# Patient Record
Sex: Male | Born: 1965 | Hispanic: Yes | Smoking: Never smoker
Health system: Southern US, Community
[De-identification: ages and names within clinical notes are randomized; demographics above are authoritative.]

## PROBLEM LIST (undated history)

## (undated) DIAGNOSIS — J329 Chronic sinusitis, unspecified: Secondary | ICD-10-CM

## (undated) DIAGNOSIS — I10 Essential (primary) hypertension: Secondary | ICD-10-CM

## (undated) DIAGNOSIS — G4730 Sleep apnea, unspecified: Secondary | ICD-10-CM

## (undated) DIAGNOSIS — E7800 Pure hypercholesterolemia, unspecified: Secondary | ICD-10-CM

## (undated) DIAGNOSIS — D759 Disease of blood and blood-forming organs, unspecified: Secondary | ICD-10-CM

## (undated) HISTORY — PX: HERNIA REPAIR: SHX51

## (undated) HISTORY — PX: KNEE SURGERY: SHX244

---

## 2017-04-10 ENCOUNTER — Other Ambulatory Visit: Payer: Self-pay | Admitting: Otolaryngology

## 2017-04-10 NOTE — Pre-Procedure Instructions (Signed)
Tim Hunt  04/10/2017      [id number] [name] [name] [address] Phone: [phone] Fax: [phone]    Your procedure is scheduled on Monday October 8.  Report to [facility] Admitting at 5:30 A.M.  Call this number if you have problems the morning of surgery:  [phone]   Remember:  Do not eat food or drink liquids after midnight.  Take these medicines the morning of surgery with A SIP OF WATER: amlodipine (norvasc), rosuvastatin (crestor)  7 days prior to surgery STOP taking any Aspirin (unless otherwise instructed by your surgeon), Aleve, Naproxen, Ibuprofen, Motrin, Advil, Goody's, BC's, all herbal medications, fish oil, and all vitamins    Do not wear jewelry, make-up or nail polish.  Do not wear lotions, powders, or perfumes, or deoderant.  Do not shave 48 hours prior to surgery.  Men may shave face and neck.  Do not bring valuables to the hospital.  [facility] is not responsible for any belongings or valuables.  Contacts, dentures or bridgework may not be worn into surgery.  Leave your suitcase in the car.  After surgery it may be brought to your room.  For patients admitted to the hospital, discharge time will be determined by your treatment team.  Patients discharged the day of surgery will not be allowed to drive home.    Special instructions:    [facility]- Preparing For Surgery  Before surgery, you can play an important role. Because skin is not sterile, your skin needs to be as free of germs as possible. You can reduce the number of germs on your skin by washing with CHG (chlorahexidine gluconate) Soap before surgery.  CHG is an antiseptic cleaner which kills germs and bonds with the skin to continue killing germs even after washing.  Please do not use if you have an allergy to CHG or antibacterial soaps. If your skin becomes reddened/irritated stop using the CHG.  Do not shave  (including legs and underarms) for at least 48 hours prior to first CHG shower. It is OK to shave your face.  Please follow these instructions carefully.   1. Shower the NIGHT BEFORE SURGERY and the MORNING OF SURGERY with CHG.   2. If you chose to wash your hair, wash your hair first as usual with your normal shampoo.  3. After you shampoo, rinse your hair and body thoroughly to remove the shampoo.  4. Use CHG as you would any other liquid soap. You can apply CHG directly to the skin and wash gently with a scrungie or a clean washcloth.   5. Apply the CHG Soap to your body ONLY FROM THE NECK DOWN.  Do not use on open wounds or open sores. Avoid contact with your eyes, ears, mouth and genitals (private parts). Wash genitals (private parts) with your normal soap.  USE REGULAR SHAMPOO AND CONDITIONER FOR HAIR USE REGULAR SOAP FOR FACE AND PRIVATE AREA  6. Wash thoroughly, paying special attention to the area where your surgery will be performed.  7. Thoroughly rinse your body with warm water from the neck down.  8. DO NOT shower/wash with your normal soap after using and rinsing off the CHG Soap.  9. Pat yourself dry with a CLEAN TOWEL and WASH CLOTH  10. Wear CLEAN PAJAMAS to bed the night before surgery, wear comfortable clothes the morning of surgery  11. Place CLEAN SHEETS on your bed the night  of your first shower and DO NOT SLEEP WITH PETS.    Day of Surgery: Do not apply any deodorants/lotions. Please wear clean clothes to the hospital/surgery center.                                                   Instrucciones Para Antes de la Ciruga   Su ciruga est programada para-(your procedure is scheduled on) October 8   [facility] Admitting - (enter)    Por favor llame al [phone] si tiene algn problema en la maana de la ciruga. (please call if you have any problems the morning of surgery.)                  Recuerde: (Remember)   No coma  alimentos ni tome lquidos, incluyendo agua, despus de la medianoche  (Do not eat food or drink liquids including water after midnight on_______________   [name] estas medicinas en la maana de la ciruga con un SORBITO de agua (take these meds the morning of surgery with a SIP of water): Amlodipine (norvasc), crestor (rosuvastatin)   Puede cepillarse los dientes en la maana de la [place]. (you may brush your teeth the morning of surgery)   No use joyas, maquillaje de ojos, lpiz labial, crema para el cuerpo o esmalte de uas oscuro. (Do not wear jewelry, eye makeup, lipstick, body lotion, or dark fingernail polish)   No puede usar desodorante. (you may wear deodorant)   Si va a ser ingresado despues de la ciruga, deje la maleta en el carro hasta que se le haya asignado una habitacin. (If you are to be admitted after surgery, leave suitcase in car until your room has been assigned.)   A los pacientes que se les d de alta el mismo da no se les permitir manejar a casa.  (Patients discharged on the day of surgery will not be allowed to drive home)   Use ropa suelta y cmoda de regreso a casa. (wear loose comfortable clothes for ride home)

## 2017-04-11 ENCOUNTER — Encounter (HOSPITAL_COMMUNITY): Payer: Self-pay

## 2017-04-11 ENCOUNTER — Encounter (HOSPITAL_COMMUNITY)
Admission: RE | Admit: 2017-04-11 | Discharge: 2017-04-11 | Disposition: A | Discharge status: Home / Self Care | Payer: BLUE CROSS/BLUE SHIELD | Source: Ambulatory Visit | Attending: Otolaryngology | Admitting: Otolaryngology

## 2017-04-11 DIAGNOSIS — E7800 Pure hypercholesterolemia, unspecified: Secondary | ICD-10-CM | POA: Insufficient documentation

## 2017-04-11 DIAGNOSIS — Z01812 Encounter for preprocedural laboratory examination: Secondary | ICD-10-CM | POA: Diagnosis not present

## 2017-04-11 DIAGNOSIS — I10 Essential (primary) hypertension: Secondary | ICD-10-CM | POA: Insufficient documentation

## 2017-04-11 DIAGNOSIS — D759 Disease of blood and blood-forming organs, unspecified: Secondary | ICD-10-CM | POA: Diagnosis not present

## 2017-04-11 DIAGNOSIS — Z01810 Encounter for preprocedural cardiovascular examination: Secondary | ICD-10-CM | POA: Diagnosis present

## 2017-04-11 HISTORY — DX: Pure hypercholesterolemia, unspecified: E78.00

## 2017-04-11 HISTORY — DX: Essential (primary) hypertension: I10

## 2017-04-11 HISTORY — DX: Chronic sinusitis, unspecified: J32.9

## 2017-04-11 HISTORY — DX: Sleep apnea, unspecified: G47.30

## 2017-04-11 HISTORY — DX: Disease of blood and blood-forming organs, unspecified: D75.9

## 2017-04-11 LAB — BASIC METABOLIC PANEL
ANION GAP: 5 (ref 5–15)
BUN: 15 mg/dL (ref 6–20)
CHLORIDE: 100 mmol/L — AB (ref 101–111)
CO2: 32 mmol/L (ref 22–32)
Calcium: 9.4 mg/dL (ref 8.9–10.3)
Creatinine, Ser: 1.12 mg/dL (ref 0.61–1.24)
GFR calc Af Amer: >60 mL/min (ref >60)
GLUCOSE: 106 mg/dL — AB (ref 65–99)
Potassium: 4.1 mmol/L (ref 3.5–5.1)
SODIUM: 137 mmol/L (ref 135–145)

## 2017-04-11 LAB — CBC
HCT: 41.1 % (ref 39.0–52.0)
HEMOGLOBIN: 12.8 g/dL — AB (ref 13.0–17.0)
MCH: 22.7 pg — ABNORMAL LOW (ref 26.0–34.0)
MCHC: 31.1 g/dL (ref 30.0–36.0)
MCV: 73 fL — AB (ref 78.0–100.0)
Platelets: 243 10*3/uL (ref 150–400)
RBC: 5.63 MIL/uL (ref 4.22–5.81)
RDW: 14.8 % (ref 11.5–15.5)
WBC: 5 10*3/uL (ref 4.0–10.5)

## 2017-04-11 NOTE — Progress Notes (Signed)
PCP - [facility]- in care everywhere Cardiologist - pt denies cardiac hx or cardiac workup.   EKG - 04/11/2017  Sleep Study - pt reports having sleep apnea but unable to use CPAP, pt states sleep study was done in [place], unsure the MD, but he can try to bring copy to hospital DAY of surgery  Patient denies shortness of breath, fever, cough and chest pain at PAT appointment   Patient verbalized understanding of instructions that were given to them at the PAT appointment. Patient was also instructed that they will need to review over the PAT instructions again at home before surgery.

## 2017-04-13 MED ORDER — OXYMETAZOLINE HCL 0.05 % NA SOLN
1.0000 | NASAL | Status: AC
  Administered 2017-04-16: 1 via NASAL
  Filled 2017-04-13: qty 15

## 2017-04-13 MED ORDER — DEXAMETHASONE SODIUM PHOSPHATE 10 MG/ML IJ SOLN
10.0000 mg | INTRAMUSCULAR | Status: AC
  Administered 2017-04-16: 10 mg via INTRAVENOUS
  Filled 2017-04-13: qty 1

## 2017-04-13 MED ORDER — DEXTROSE 5 % IV SOLN
3.0000 g | INTRAVENOUS | Status: DC
  Filled 2017-04-13: qty 3000

## 2017-04-15 NOTE — Anesthesia Preprocedure Evaluation (Addendum)
Anesthesia Evaluation  Patient identified by MRN, date of birth, ID band Patient awake    Reviewed: Allergy & Precautions, NPO status , Patient's Chart, lab work & pertinent test results  History of Anesthesia Complications Negative for: history of anesthetic complications  Airway Mallampati: II  TM Distance: >3 FB Neck ROM: Full    Dental no notable dental hx. (+) Dental Advisory Given   Pulmonary sleep apnea ,    Pulmonary exam normal        Cardiovascular hypertension, Normal cardiovascular exam     Neuro/Psych negative neurological ROS  negative psych ROS   GI/Hepatic negative GI ROS, Neg liver ROS,   Endo/Other  negative endocrine ROS  Renal/GU negative Renal ROS     Musculoskeletal negative musculoskeletal ROS (+)   Abdominal   Peds  Hematology negative hematology ROS (+)   Anesthesia Other Findings Day of surgery medications reviewed with the patient.  Reproductive/Obstetrics                            Anesthesia Physical Anesthesia Plan  ASA: II  Anesthesia Plan: General   Post-op Pain Management:    Induction: Intravenous  PONV Risk Score and Plan: 3 and Ondansetron, Dexamethasone and Scopolamine patch - Pre-op  Airway Management Planned: Oral ETT  Additional Equipment:   Intra-op Plan:   Post-operative Plan: Extubation in OR  Informed Consent: I have reviewed the patients History and Physical, chart, labs and discussed the procedure including the risks, benefits and alternatives for the proposed anesthesia with the patient or authorized representative who has indicated his/her understanding and acceptance.   Dental advisory given  Plan Discussed with: Anesthesiologist, Surgeon and CRNA  Anesthesia Plan Comments:        Anesthesia Quick Evaluation

## 2017-04-16 ENCOUNTER — Ambulatory Visit (HOSPITAL_COMMUNITY)
Admission: RE | Admit: 2017-04-16 | Discharge: 2017-04-16 | Disposition: A | Discharge status: Home / Self Care | Payer: BLUE CROSS/BLUE SHIELD | Source: Ambulatory Visit | Attending: Otolaryngology | Admitting: Otolaryngology

## 2017-04-16 ENCOUNTER — Ambulatory Visit (HOSPITAL_COMMUNITY): Payer: BLUE CROSS/BLUE SHIELD | Admitting: Anesthesiology

## 2017-04-16 ENCOUNTER — Encounter (HOSPITAL_COMMUNITY)
Admission: RE | Disposition: A | Discharge status: Home / Self Care | Payer: Self-pay | Source: Ambulatory Visit | Attending: Otolaryngology

## 2017-04-16 ENCOUNTER — Encounter (HOSPITAL_COMMUNITY): Payer: Self-pay | Admitting: *Deleted

## 2017-04-16 DIAGNOSIS — I10 Essential (primary) hypertension: Secondary | ICD-10-CM | POA: Insufficient documentation

## 2017-04-16 DIAGNOSIS — E7800 Pure hypercholesterolemia, unspecified: Secondary | ICD-10-CM | POA: Insufficient documentation

## 2017-04-16 DIAGNOSIS — J3489 Other specified disorders of nose and nasal sinuses: Secondary | ICD-10-CM | POA: Diagnosis not present

## 2017-04-16 DIAGNOSIS — J343 Hypertrophy of nasal turbinates: Secondary | ICD-10-CM | POA: Insufficient documentation

## 2017-04-16 DIAGNOSIS — Z79899 Other long term (current) drug therapy: Secondary | ICD-10-CM | POA: Diagnosis not present

## 2017-04-16 DIAGNOSIS — Z7982 Long term (current) use of aspirin: Secondary | ICD-10-CM | POA: Insufficient documentation

## 2017-04-16 DIAGNOSIS — J342 Deviated nasal septum: Secondary | ICD-10-CM | POA: Diagnosis not present

## 2017-04-16 DIAGNOSIS — G4733 Obstructive sleep apnea (adult) (pediatric): Secondary | ICD-10-CM | POA: Diagnosis not present

## 2017-04-16 HISTORY — PX: NASAL SEPTOPLASTY W/ TURBINOPLASTY: SHX2070

## 2017-04-16 SURGERY — SEPTOPLASTY, NOSE, WITH NASAL TURBINATE REDUCTION
Anesthesia: General | Site: Nose | Laterality: Bilateral

## 2017-04-16 MED ORDER — LIDOCAINE HCL (CARDIAC) 20 MG/ML IV SOLN
INTRAVENOUS | Status: DC | PRN
  Administered 2017-04-16: 100 mg via INTRAVENOUS

## 2017-04-16 MED ORDER — ROCURONIUM BROMIDE 10 MG/ML (PF) SYRINGE
PREFILLED_SYRINGE | INTRAVENOUS | Status: AC
  Filled 2017-04-16: qty 5

## 2017-04-16 MED ORDER — FENTANYL CITRATE (PF) 250 MCG/5ML IJ SOLN
INTRAMUSCULAR | Status: AC
  Filled 2017-04-16: qty 5

## 2017-04-16 MED ORDER — PROMETHAZINE HCL 25 MG/ML IJ SOLN: 6.2500 mg | INTRAMUSCULAR | Status: DC | PRN

## 2017-04-16 MED ORDER — PROPOFOL 10 MG/ML IV BOLUS
INTRAVENOUS | Status: AC
  Filled 2017-04-16: qty 20

## 2017-04-16 MED ORDER — ONDANSETRON HCL 4 MG/2ML IJ SOLN
INTRAMUSCULAR | Status: DC | PRN
  Administered 2017-04-16: 4 mg via INTRAVENOUS

## 2017-04-16 MED ORDER — LIDOCAINE 2% (20 MG/ML) 5 ML SYRINGE
INTRAMUSCULAR | Status: AC
  Filled 2017-04-16: qty 5

## 2017-04-16 MED ORDER — SUGAMMADEX SODIUM 200 MG/2ML IV SOLN
INTRAVENOUS | Status: DC | PRN
  Administered 2017-04-16 (×2): 200 mg via INTRAVENOUS

## 2017-04-16 MED ORDER — ESMOLOL HCL 100 MG/10ML IV SOLN
INTRAVENOUS | Status: AC
  Filled 2017-04-16: qty 10

## 2017-04-16 MED ORDER — MUPIROCIN 2 % EX OINT
TOPICAL_OINTMENT | CUTANEOUS | Status: AC
  Filled 2017-04-16: qty 22

## 2017-04-16 MED ORDER — LIDOCAINE-EPINEPHRINE 1 %-1:100000 IJ SOLN
INTRAMUSCULAR | Status: AC
  Filled 2017-04-16: qty 1

## 2017-04-16 MED ORDER — AMOXICILLIN-POT CLAVULANATE 500-125 MG PO TABS: 1.0000 | ORAL_TABLET | Freq: Two times a day (BID) | ORAL | 0 refills | Status: DC

## 2017-04-16 MED ORDER — HYDROMORPHONE HCL 1 MG/ML IJ SOLN
0.2500 mg | INTRAMUSCULAR | Status: DC | PRN
  Administered 2017-04-16: 0.5 mg via INTRAVENOUS

## 2017-04-16 MED ORDER — ROCURONIUM BROMIDE 100 MG/10ML IV SOLN
INTRAVENOUS | Status: DC | PRN
  Administered 2017-04-16: 70 mg via INTRAVENOUS

## 2017-04-16 MED ORDER — ONDANSETRON HCL 4 MG/2ML IJ SOLN
INTRAMUSCULAR | Status: AC
  Filled 2017-04-16: qty 2

## 2017-04-16 MED ORDER — MIDAZOLAM HCL 2 MG/2ML IJ SOLN
INTRAMUSCULAR | Status: AC
  Filled 2017-04-16: qty 2

## 2017-04-16 MED ORDER — SCOPOLAMINE 1 MG/3DAYS TD PT72
1.0000 | MEDICATED_PATCH | TRANSDERMAL | Status: DC
  Administered 2017-04-16: 1.5 mg via TRANSDERMAL
  Filled 2017-04-16: qty 1

## 2017-04-16 MED ORDER — HYDROMORPHONE HCL 1 MG/ML IJ SOLN
INTRAMUSCULAR | Status: AC
  Filled 2017-04-16: qty 1

## 2017-04-16 MED ORDER — CHLORHEXIDINE GLUCONATE CLOTH 2 % EX PADS: 6.0000 | MEDICATED_PAD | Freq: Once | CUTANEOUS | Status: DC

## 2017-04-16 MED ORDER — PROPOFOL 10 MG/ML IV BOLUS
INTRAVENOUS | Status: DC | PRN
  Administered 2017-04-16: 200 mg via INTRAVENOUS

## 2017-04-16 MED ORDER — MUPIROCIN 2 % EX OINT
TOPICAL_OINTMENT | CUTANEOUS | Status: DC | PRN
  Administered 2017-04-16: 1 via NASAL

## 2017-04-16 MED ORDER — MUPIROCIN CALCIUM 2 % EX CREA
TOPICAL_CREAM | CUTANEOUS | Status: AC
  Filled 2017-04-16: qty 15

## 2017-04-16 MED ORDER — HYDROCODONE-ACETAMINOPHEN 5-325 MG PO TABS: 1.0000 | ORAL_TABLET | Freq: Four times a day (QID) | ORAL | 0 refills | Status: DC | PRN

## 2017-04-16 MED ORDER — FENTANYL CITRATE (PF) 100 MCG/2ML IJ SOLN
INTRAMUSCULAR | Status: DC | PRN
  Administered 2017-04-16 (×3): 50 ug via INTRAVENOUS

## 2017-04-16 MED ORDER — OXYMETAZOLINE HCL 0.05 % NA SOLN
NASAL | Status: AC
  Filled 2017-04-16: qty 15

## 2017-04-16 MED ORDER — LACTATED RINGERS IV SOLN
INTRAVENOUS | Status: DC | PRN
  Administered 2017-04-16: 08:00:00 via INTRAVENOUS

## 2017-04-16 MED ORDER — OXYMETAZOLINE HCL 0.05 % NA SOLN
NASAL | Status: DC | PRN
  Administered 2017-04-16: 1

## 2017-04-16 MED ORDER — 0.9 % SODIUM CHLORIDE (POUR BTL) OPTIME
TOPICAL | Status: DC | PRN
  Administered 2017-04-16: 1000 mL

## 2017-04-16 MED ORDER — SUGAMMADEX SODIUM 200 MG/2ML IV SOLN
INTRAVENOUS | Status: AC
  Filled 2017-04-16: qty 2

## 2017-04-16 MED ORDER — MIDAZOLAM HCL 5 MG/5ML IJ SOLN
INTRAMUSCULAR | Status: DC | PRN
  Administered 2017-04-16: 2 mg via INTRAVENOUS

## 2017-04-16 MED ORDER — LIDOCAINE-EPINEPHRINE 1 %-1:100000 IJ SOLN
INTRAMUSCULAR | Status: DC | PRN
  Administered 2017-04-16: 7 mL

## 2017-04-16 MED ORDER — DEXAMETHASONE SODIUM PHOSPHATE 10 MG/ML IJ SOLN
INTRAMUSCULAR | Status: AC
  Filled 2017-04-16: qty 1

## 2017-04-16 MED ORDER — ESMOLOL HCL 100 MG/10ML IV SOLN
INTRAVENOUS | Status: DC | PRN
  Administered 2017-04-16: 10 mg via INTRAVENOUS
  Administered 2017-04-16: 20 mg via INTRAVENOUS

## 2017-04-16 SURGICAL SUPPLY — 25 items
BLADE SURG 15 STRL LF DISP TIS (BLADE) ×1 IMPLANT
BLADE SURG 15 STRL SS (BLADE) ×1
CANISTER SUCT 3000ML PPV (MISCELLANEOUS) ×2 IMPLANT
COAGULATOR SUCT 8FR VV (MISCELLANEOUS) IMPLANT
DRAPE HALF SHEET 40X57 (DRAPES) IMPLANT
ELECT REM PT RETURN 9FT ADLT (ELECTROSURGICAL)
ELECTRODE REM PT RTRN 9FT ADLT (ELECTROSURGICAL) IMPLANT
GAUZE SPONGE 2X2 8PLY STRL LF (GAUZE/BANDAGES/DRESSINGS) ×1 IMPLANT
GLOVE BIOGEL M 7.0 STRL (GLOVE) ×4 IMPLANT
GOWN STRL REUS W/ TWL LRG LVL3 (GOWN DISPOSABLE) ×2 IMPLANT
GOWN STRL REUS W/TWL LRG LVL3 (GOWN DISPOSABLE) ×2
KIT BASIN OR (CUSTOM PROCEDURE TRAY) ×2 IMPLANT
KIT ROOM TURNOVER OR (KITS) ×2 IMPLANT
NEEDLE HYPO 25GX1X1/2 BEV (NEEDLE) ×2 IMPLANT
NS IRRIG 1000ML POUR BTL (IV SOLUTION) ×2 IMPLANT
PAD ARMBOARD 7.5X6 YLW CONV (MISCELLANEOUS) ×2 IMPLANT
SPLINT NASAL DOYLE BI-VL (GAUZE/BANDAGES/DRESSINGS) ×2 IMPLANT
SPONGE GAUZE 2X2 STER 10/PKG (GAUZE/BANDAGES/DRESSINGS) ×1
SPONGE NEURO XRAY DETECT 1X3 (DISPOSABLE) ×2 IMPLANT
SUT ETHILON 3 0 PS 1 (SUTURE) ×2 IMPLANT
SUT PLAIN 4 0 ~~LOC~~ 1 (SUTURE) ×2 IMPLANT
TOWEL OR 17X24 6PK STRL BLUE (TOWEL DISPOSABLE) ×2 IMPLANT
TRAY ENT MC OR (CUSTOM PROCEDURE TRAY) ×2 IMPLANT
TUBE SALEM SUMP 16 FR W/ARV (TUBING) ×2 IMPLANT
TUBING EXTENTION W/L.L. (IV SETS) ×2 IMPLANT

## 2017-04-16 NOTE — Transfer of Care (Signed)
Immediate Anesthesia Transfer of Care Note  Patient: Tim Hunt  Procedure(s) Performed: NASAL SEPTOPLASTY WITH BILATERAL INFERIOR TURBINATE REDUCTION (Bilateral Nose)  Patient Location: PACU  Anesthesia Type:General  Level of Consciousness: drowsy and patient cooperative  Airway & Oxygen Therapy: Patient Spontanous Breathing and Patient connected to face mask oxygen  Post-op Assessment: Report given to RN, Post -op Vital signs reviewed and stable and Patient moving all extremities X 4  Post vital signs: Reviewed and stable  Last Vitals:  Vitals:   04/16/17 0556 04/16/17 0840  BP: (!) 151/89 (!) 143/89  Pulse: 65 81  Resp: 19 12  Temp: 36.6 C 36.8 C  SpO2: 99% 97%    Last Pain:  Vitals:   04/16/17 0556  TempSrc: Oral      Patients Stated Pain Goal: 3 (04/16/17 0641)  Complications: No apparent anesthesia complications

## 2017-04-16 NOTE — Op Note (Signed)
Operative Note:  Nasal Septoplasty    Bilateral Inferior Turbinate Reduction   Patient: Tim Hunt  Medical record number: [id number]  Date: 04/16/2017  Pre-operative Indications:  Nasal septal deviation     Inf Turbinate Hypertrophy  Postoperative Indications: Same  Surgical Procedure: Nasal Septoplasty     Bilateral Inferior Turbinate Hypertrophy  Anesthesia: GET  Surgeon: Barbee Cough, M.D.  Complications: None  EBL: 50 cc   Brief History: The patient is a 51 y.o. male with a history of progressive nasal airway obstruction. The patient has a history of nasal obstruction and physical examination shows severe nasal septal deviation and inferior turbinate hypertrophy. Based on the patient's history and findings I recommended nasal septoplasty and inferior turbinate reduction under general anesthesia, risks and benefits were discussed in detail with the patient. They understand and agree with our plan for surgery which is scheduled on elective basis at [facility] OR.  Surgical Procedure: The patient is brought to the operating room on 04/16/2017 and placed in supine position on the operating table. General endotracheal anesthesia was established without difficulty. When the patient was adequately anesthetized, surgical timeout was performed and correct identification of the patient and the surgical procedure. The patient's nose was then injected with 7 cc of 1% lidocaine 1 100,000 dilution epinephrine which was injected in a submucosal fashion along the nasal septum and inferior turbinates bilaterally. The patient's nose was then packed with Afrin-soaked cottonoid pledgets which were left in place for approximately 10 minutes to allow for vasoconstriction and hemostasis. The patient was positioned and prepped and draped in sterile fashion.  The patient's nasal cavity was inspected, they had a severely deviated nasal septum. Septoplasty was undertaken beginning with a left  anterior hemitransfixion incision which was carried to the mucosa and underlying submucosa. A mucoperichondrial flap was elevated on the left side. The anterior septal cartilage was crossed and a mucoperichondrial flap was elevated on the opposite side. This allowed exposure of the entire nasal septum. Anterior septal cartilage was resected, the cartilage tissue was morcellized and later returned to the mucoperichondrial pocket. Dissection was then carried from anterior to posterior removing deviated bone and cartilage. Inferior bony septal spur elevated with blunt and sharp dissection and removed, preserving the overlying mucosa. With the septum brought to a good stable midline position, the morcellized cartilage was returned to the mucoperichondrial pocket. The mucosal flaps are then reapproximated with a 4-0 gut suture on a Keith needle in a horizontal matching fashion. The anterior hemitransfixion incision was closed with the same suture. At the conclusion of the surgical procedure, Doyle nasal septal splints were placed after the application of Bactroban ointment and sutured into position with 3-0 Ethilon suture.  Turbinate reduction was then performed. Using bipolar intramural cautery set at 12 W, 2 submucosal passes made in each inferior turbinate. Vertical incisions were created in the anterior aspect of each inferior turbinate, soft tissue was elevated and a small amount of turbinate bone was resected. The turbinates were then outfractured creating a widely patent nasal cavity. There was minimal bleeding.  An orogastric tube was passed and the stomach contents were aspirated. Oropharynx was suctioned, no loose or broken teeth. No active bleeding. The patient was awakened from their anesthetic and transferred from the operating room to the recovery room in stable condition. There were no complications and blood loss was 50 mL.   Barbee Cough, M.D. [facility] 04/16/2017

## 2017-04-16 NOTE — Anesthesia Postprocedure Evaluation (Signed)
Anesthesia Post Note  Patient: Tim Hunt  Procedure(s) Performed: NASAL SEPTOPLASTY WITH BILATERAL INFERIOR TURBINATE REDUCTION (Bilateral Nose)     Patient location during evaluation: PACU Anesthesia Type: General Level of consciousness: sedated Pain management: pain level controlled Vital Signs Assessment: post-procedure vital signs reviewed and stable Respiratory status: spontaneous breathing and respiratory function stable Cardiovascular status: stable Postop Assessment: no apparent nausea or vomiting Anesthetic complications: no    Last Vitals:  Vitals:   04/16/17 0910 04/16/17 0925  BP: 130/86 123/80  Pulse: 68 64  Resp: 20 16  Temp:    SpO2: 96% 96%    Last Pain:  Vitals:   04/16/17 0933  TempSrc:   PainSc: 5                  Cobain Morici DANIEL

## 2017-04-16 NOTE — Anesthesia Procedure Notes (Addendum)
Procedure Name: Intubation Date/Time: 04/16/2017 7:50 AM Performed by: Geraldo Docker Pre-anesthesia Checklist: Patient identified, Emergency Drugs available, Suction available, Patient being monitored and Timeout performed Patient Re-evaluated:Patient Re-evaluated prior to induction Oxygen Delivery Method: Circle system utilized Preoxygenation: Pre-oxygenation with 100% oxygen Induction Type: IV induction Ventilation: Mask ventilation without difficulty Laryngoscope Size: Miller and 3 Grade View: Grade I Tube size: 7.5 mm Number of attempts: 1 Airway Equipment and Method: Patient positioned with wedge pillow and Stylet Placement Confirmation: ETT inserted through vocal cords under direct vision,  positive ETCO2 and breath sounds checked- equal and bilateral Secured at: 22 cm Tube secured with: Tape Dental Injury: Teeth and Oropharynx as per pre-operative assessment

## 2017-04-16 NOTE — H&P (Signed)
Tim Hunt is an 51 y.o. male.   Chief Complaint: Nasal airway obstruction HPI: Progressive obstruction, Severe septal deviation and hx of OSA  Past Medical History:  Diagnosis Date  . Blood dyscrasia    "red and white blood cells run low"  . Hypercholesterolemia   . Hypertension   . Sinusitis, chronic   . Sleep apnea    does not use CPAP anymore    Past Surgical History:  Procedure Laterality Date  . HERNIA REPAIR     umbilical  . KNEE SURGERY     right knee surgery    History reviewed. No pertinent family history. Social History:  reports that he has never smoked. He has never used smokeless tobacco. He reports that he drinks alcohol. He reports that he does not use drugs.  Allergies: No Known Allergies  Medications Prior to Admission  Medication Sig Dispense Refill  . amLODipine (NORVASC) 5 MG tablet Take 5 mg by mouth daily.    Marland Kitchen aspirin EC 81 MG tablet Take 81 mg by mouth daily.    . rosuvastatin (CRESTOR) 20 MG tablet Take 20 mg by mouth every morning.      No results found for this or any previous visit (from the past 48 hour(s)). No results found.  Review of Systems  Constitutional: Negative.   HENT: Negative.   Respiratory: Negative.   Cardiovascular: Negative.     Blood pressure (!) 151/89, pulse 65, temperature 97.9 F (36.6 C), temperature source Oral, resp. rate 19, SpO2 99 %. Physical Exam  Constitutional: He appears well-developed and well-nourished.  HENT:  Severe septal deviation  Neck: Normal range of motion. Neck supple.  Cardiovascular: Normal rate.   Respiratory: Effort normal.  GI: Soft.     Assessment/Plan Adm for OP nasal surgery under GA  Raahil Ong, MD 04/16/2017, 7:39 AM

## 2017-04-17 ENCOUNTER — Encounter (HOSPITAL_COMMUNITY): Payer: Self-pay | Admitting: Otolaryngology

## 2017-04-17 ENCOUNTER — Emergency Department (HOSPITAL_COMMUNITY)
Admission: EM | Admit: 2017-04-17 | Discharge: 2017-04-17 | Disposition: A | Discharge status: Home / Self Care | Payer: BLUE CROSS/BLUE SHIELD | Attending: Emergency Medicine | Admitting: Emergency Medicine

## 2017-04-17 DIAGNOSIS — Z9889 Other specified postprocedural states: Secondary | ICD-10-CM | POA: Insufficient documentation

## 2017-04-17 DIAGNOSIS — I10 Essential (primary) hypertension: Secondary | ICD-10-CM | POA: Diagnosis not present

## 2017-04-17 DIAGNOSIS — R0981 Nasal congestion: Secondary | ICD-10-CM | POA: Diagnosis present

## 2017-04-17 DIAGNOSIS — Z79899 Other long term (current) drug therapy: Secondary | ICD-10-CM | POA: Insufficient documentation

## 2017-04-17 NOTE — ED Provider Notes (Signed)
[facility]EMER[facility]ENCY DEPT Provider Note   CSN: [id number] Arrival date & time: 04/17/17  1853     History   Chief Complaint Chief Complaint  Patient presents with  . Nasal Congestion    HPI Tim Hunt is a 51 y.o. male.  HPI   Tim Hunt is a 50 y.o. male, with a history of HTN, presenting to the ED with Difficulty breathing through his nose. Patient underwent a Nasal septoplasty with bilateral inferior turbinate reduction yesterday performed by Dr. Annalee Genta. States he "feels like there is some congestion or something blocking the nasal passages. He states that his discharge paperwork told him to come to the ED if he had chest pain or difficulty breathing and he presents today because he is having difficulty breathing through his nose. He adds that he call Dr. Thurmon Fair office and was told to continue using his saline nasal spray. Patient states he has been using the nasal spray every hour without improvement. Patient denies fever/chills, nausea/vomiting, difficulty swallowing, chest pain, or any other complaints.      Past Medical History:  Diagnosis Date  . Blood dyscrasia    "red and white blood cells run low"  . Hypercholesterolemia   . Hypertension   . Sinusitis, chronic   . Sleep apnea    does not use CPAP anymore    Patient Active Problem List   Diagnosis Date Noted  . Deviated septum 04/16/2017  . Obstructive sleep apnea 04/16/2017    Past Surgical History:  Procedure Laterality Date  . HERNIA REPAIR     umbilical  . KNEE SURGERY     right knee surgery  . NASAL SEPTOPLASTY W/ TURBINOPLASTY Bilateral 04/16/2017   Procedure: NASAL SEPTOPLASTY WITH BILATERAL INFERIOR TURBINATE REDUCTION;  Surgeon: Osborn Coho, MD;  Location: [facility] OR;  Service: ENT;  Laterality: Bilateral;       Home Medications    Prior to Admission medications   Medication Sig Start Date End Date Taking? Authorizing Provider  amLODipine (NORVASC) 5 MG tablet Take 5 mg by  mouth daily.    [provider]  amoxicillin-clavulanate (AUGMENTIN) 500-125 MG tablet Take 1 tablet (500 mg total) by mouth 2 (two) times daily. 04/16/17   Osborn Coho, MD  HYDROcodone-acetaminophen (NORCO) 5-325 MG tablet Take 1-2 tablets by mouth every 6 (six) hours as needed. 04/16/17   Osborn Coho, MD  rosuvastatin (CRESTOR) 20 MG tablet Take 20 mg by mouth every morning.    [provider]    Family History No family history on file.  Social History Social History  Substance Use Topics  . Smoking status: Never Smoker  . Smokeless tobacco: Never Used  . Alcohol use Yes     Comment: beer occasionally      Allergies   Patient has no known allergies.   Review of Systems Review of Systems  Constitutional: Negative for chills and fever.  HENT: Negative for facial swelling and trouble swallowing.        Difficulty breathing through nose  Respiratory: Negative for shortness of breath.   Cardiovascular: Negative for chest pain.  Gastrointestinal: Negative for nausea and vomiting.     Physical Exam Updated Vital Signs BP 139/78 (BP Location: Left Arm)   Pulse 62   Temp 98.5 F (36.9 C) (Oral)   Resp 16   Ht  (1.905 m)   Wt 119.3 kg (263 lb)   SpO2 97%   BMI 32.87 kg/m   Physical Exam  Constitutional: He appears well-developed and  well-nourished. No distress.  HENT:  Head: Normocephalic and atraumatic.  Noted facial swelling. No noted septal hematoma. No active hemorrhage. Some edema noted to bilateral lateral mucosa, however, nasopharynx appears generally clear.  Eyes: Conjunctivae are normal.  Neck: Neck supple.  Cardiovascular: Normal rate and regular rhythm.   Pulmonary/Chest: Effort normal.  Neurological: He is alert.  Skin: Skin is warm and dry. He is not diaphoretic. No pallor.  Psychiatric: He has a normal mood and affect. His behavior is normal.  Nursing note and vitals reviewed.    ED Treatments / Results  Labs (all  labs ordered are listed, but only abnormal results are displayed) Labs Reviewed - No data to display  EKG  EKG Interpretation None       Radiology No results found.  Procedures Procedures (including critical care time)  Medications Ordered in ED Medications - No data to display   Initial Impression / Assessment and Plan / ED Course  I have reviewed the triage vital signs and the nursing notes.  Pertinent labs & imaging results that were available during my care of the patient were reviewed by me and considered in my medical decision making (see chart for details).  Clinical Course as of Apr 17 2225  Tue Apr 17, 2017  2207 Spoke with Dr. Annalee Genta, ENT, who agrees that this nasal congestion and restricted air intake through the nasal passages is a normal part of the healing process and he can expect to have nasal congestion for a number of weeks. Recommends continuing to use the saline nasal spray every hour and sleeping upright.  [SJ]    Clinical Course User Index [SJ] Joy, Shawn C, PA-C    Patient presents with nasal congestion status post septoplasty yesterday. Dr. Annalee Genta states this is normal. This was discussed with the patient. Patient is advised to continue with previously prescribed regimen and follow up as scheduled.  Final Clinical Impressions(s) / ED Diagnoses   Final diagnoses:  Nasal congestion    New Prescriptions Discharge Medication List as of 04/17/2017 10:12 PM       Anselm Pancoast, PA-C 04/17/17 2227    Benjiman Core, MD 04/17/17 2332

## 2017-04-17 NOTE — ED Notes (Addendum)
Pt has had some bleeding from nose post surgery. Pt has had difficulty breathing through his nose.

## 2017-04-17 NOTE — ED Triage Notes (Signed)
Patient reports nasal congestion today , s/p  nasal septoplasty yesterday by Dr. Annalee Genta , advised by MD to flush with saline solution with no relief.

## 2017-04-17 NOTE — Discharge Instructions (Signed)
Dr. Annalee Genta states that nasal congestion and decreased ability to breathe through the nose is a normal part of the healing process and can be expected for a number of weeks after a procedure such as this. Dr. Annalee Genta advises to continue using the saline nasal spray every hour, as previously directed. Sleep upright to minimize symptoms. Follow up with Dr. Annalee Genta in the office, as scheduled.

## 2019-06-17 ENCOUNTER — Encounter (HOSPITAL_COMMUNITY): Payer: Self-pay

## 2019-06-17 ENCOUNTER — Emergency Department (HOSPITAL_COMMUNITY): Payer: BC Managed Care – PPO

## 2019-06-17 ENCOUNTER — Other Ambulatory Visit: Payer: Self-pay

## 2019-06-17 ENCOUNTER — Inpatient Hospital Stay (HOSPITAL_COMMUNITY)
Admission: EM | Admit: 2019-06-17 | Discharge: 2019-06-21 | DRG: 177 | Disposition: A | Discharge status: Home / Self Care | Payer: BC Managed Care – PPO | Attending: Family Medicine | Admitting: Family Medicine

## 2019-06-17 DIAGNOSIS — Z7984 Long term (current) use of oral hypoglycemic drugs: Secondary | ICD-10-CM

## 2019-06-17 DIAGNOSIS — J9601 Acute respiratory failure with hypoxia: Secondary | ICD-10-CM | POA: Diagnosis present

## 2019-06-17 DIAGNOSIS — E119 Type 2 diabetes mellitus without complications: Secondary | ICD-10-CM | POA: Diagnosis not present

## 2019-06-17 DIAGNOSIS — E785 Hyperlipidemia, unspecified: Secondary | ICD-10-CM | POA: Diagnosis present

## 2019-06-17 DIAGNOSIS — U071 COVID-19: Secondary | ICD-10-CM | POA: Diagnosis present

## 2019-06-17 DIAGNOSIS — G4733 Obstructive sleep apnea (adult) (pediatric): Secondary | ICD-10-CM | POA: Diagnosis present

## 2019-06-17 DIAGNOSIS — H109 Unspecified conjunctivitis: Secondary | ICD-10-CM | POA: Diagnosis present

## 2019-06-17 DIAGNOSIS — T380X5A Adverse effect of glucocorticoids and synthetic analogues, initial encounter: Secondary | ICD-10-CM | POA: Diagnosis present

## 2019-06-17 DIAGNOSIS — R7989 Other specified abnormal findings of blood chemistry: Secondary | ICD-10-CM | POA: Diagnosis not present

## 2019-06-17 DIAGNOSIS — I10 Essential (primary) hypertension: Secondary | ICD-10-CM | POA: Diagnosis present

## 2019-06-17 DIAGNOSIS — E1165 Type 2 diabetes mellitus with hyperglycemia: Secondary | ICD-10-CM | POA: Diagnosis present

## 2019-06-17 DIAGNOSIS — R040 Epistaxis: Secondary | ICD-10-CM | POA: Diagnosis present

## 2019-06-17 DIAGNOSIS — E7800 Pure hypercholesterolemia, unspecified: Secondary | ICD-10-CM | POA: Diagnosis present

## 2019-06-17 DIAGNOSIS — D62 Acute posthemorrhagic anemia: Secondary | ICD-10-CM | POA: Diagnosis present

## 2019-06-17 DIAGNOSIS — J1289 Other viral pneumonia: Secondary | ICD-10-CM | POA: Diagnosis present

## 2019-06-17 DIAGNOSIS — J1282 Pneumonia due to coronavirus disease 2019: Secondary | ICD-10-CM

## 2019-06-17 LAB — COMPREHENSIVE METABOLIC PANEL
ALT: 168 U/L — ABNORMAL HIGH (ref 0–44)
AST: 106 U/L — ABNORMAL HIGH (ref 15–41)
Albumin: 3.7 g/dL (ref 3.5–5.0)
Alkaline Phosphatase: 49 U/L (ref 38–126)
Anion gap: 13 (ref 5–15)
BUN: 14 mg/dL (ref 6–20)
CO2: 26 mmol/L (ref 22–32)
Calcium: 8.7 mg/dL — ABNORMAL LOW (ref 8.9–10.3)
Chloride: 99 mmol/L (ref 98–111)
Creatinine, Ser: 1.2 mg/dL (ref 0.61–1.24)
GFR calc Af Amer: >60 mL/min (ref >60)
GFR calc non Af Amer: >60 mL/min (ref >60)
Glucose, Bld: 112 mg/dL — ABNORMAL HIGH (ref 70–99)
Potassium: 4.2 mmol/L (ref 3.5–5.1)
Sodium: 138 mmol/L (ref 135–145)
Total Bilirubin: 0.5 mg/dL (ref 0.3–1.2)
Total Protein: 8 g/dL (ref 6.5–8.1)

## 2019-06-17 LAB — CBC WITH DIFFERENTIAL/PLATELET
Abs Immature Granulocytes: 0.03 10*3/uL (ref 0.00–0.07)
Basophils Absolute: 0 10*3/uL (ref 0.0–0.1)
Basophils Relative: 0 %
Eosinophils Absolute: 0 10*3/uL (ref 0.0–0.5)
Eosinophils Relative: 0 %
HCT: 43 % (ref 39.0–52.0)
Hemoglobin: 13.1 g/dL (ref 13.0–17.0)
Immature Granulocytes: 0 %
Lymphocytes Relative: 14 %
Lymphs Abs: 0.9 10*3/uL (ref 0.7–4.0)
MCH: 22.6 pg — ABNORMAL LOW (ref 26.0–34.0)
MCHC: 30.5 g/dL (ref 30.0–36.0)
MCV: 74.3 fL — ABNORMAL LOW (ref 80.0–100.0)
Monocytes Absolute: 0.2 10*3/uL (ref 0.1–1.0)
Monocytes Relative: 3 %
Neutro Abs: 5.6 10*3/uL (ref 1.7–7.7)
Neutrophils Relative %: 83 %
Platelets: 197 10*3/uL (ref 150–400)
RBC: 5.79 MIL/uL (ref 4.22–5.81)
RDW: 15.4 % (ref 11.5–15.5)
WBC: 6.8 10*3/uL (ref 4.0–10.5)
nRBC: 0 % (ref 0.0–0.2)

## 2019-06-17 LAB — TRIGLYCERIDES: Triglycerides: 81 mg/dL (ref <150)

## 2019-06-17 LAB — PROCALCITONIN: Procalcitonin: <0.1 ng/mL

## 2019-06-17 LAB — FERRITIN: Ferritin: 642 ng/mL — ABNORMAL HIGH (ref 24–336)

## 2019-06-17 LAB — D-DIMER, QUANTITATIVE: D-Dimer, Quant: 1.22 ug/mL-FEU — ABNORMAL HIGH (ref 0.00–0.50)

## 2019-06-17 LAB — LACTATE DEHYDROGENASE: LDH: 360 U/L — ABNORMAL HIGH (ref 98–192)

## 2019-06-17 LAB — LACTIC ACID, PLASMA: Lactic Acid, Venous: 1.3 mmol/L (ref 0.5–1.9)

## 2019-06-17 LAB — POC SARS CORONAVIRUS 2 AG -  ED: SARS Coronavirus 2 Ag: POSITIVE — AB

## 2019-06-17 LAB — FIBRINOGEN: Fibrinogen: 605 mg/dL — ABNORMAL HIGH (ref 210–475)

## 2019-06-17 LAB — C-REACTIVE PROTEIN: CRP: 12.9 mg/dL — ABNORMAL HIGH (ref <1.0)

## 2019-06-17 LAB — HIV ANTIBODY (ROUTINE TESTING W REFLEX): HIV Screen 4th Generation wRfx: NONREACTIVE

## 2019-06-17 MED ORDER — HYDROCOD POLST-CPM POLST ER 10-8 MG/5ML PO SUER
5.0000 mL | Freq: Two times a day (BID) | ORAL | Status: DC | PRN
  Administered 2019-06-17: 5 mL via ORAL
  Filled 2019-06-17: qty 5

## 2019-06-17 MED ORDER — DEXAMETHASONE SODIUM PHOSPHATE 10 MG/ML IJ SOLN
6.0000 mg | INTRAMUSCULAR | Status: DC
  Administered 2019-06-17: 6 mg via INTRAVENOUS
  Filled 2019-06-17: qty 1

## 2019-06-17 MED ORDER — TOCILIZUMAB 400 MG/20ML IV SOLN
640.0000 mg | Freq: Once | INTRAVENOUS | Status: AC
  Administered 2019-06-17: 640 mg via INTRAVENOUS
  Filled 2019-06-17: qty 32

## 2019-06-17 MED ORDER — SODIUM CHLORIDE 0.9 % IV SOLN
200.0000 mg | Freq: Once | INTRAVENOUS | Status: AC
  Administered 2019-06-17: 200 mg via INTRAVENOUS
  Filled 2019-06-17: qty 40

## 2019-06-17 MED ORDER — SODIUM CHLORIDE 0.9 % IV SOLN
100.0000 mg | Freq: Every day | INTRAVENOUS | Status: DC
  Administered 2019-06-18 – 2019-06-20 (×3): 100 mg via INTRAVENOUS
  Filled 2019-06-17 (×4): qty 20

## 2019-06-17 MED ORDER — GUAIFENESIN-DM 100-10 MG/5ML PO SYRP: 10.0000 mL | ORAL_SOLUTION | ORAL | Status: DC | PRN

## 2019-06-17 MED ORDER — ENOXAPARIN SODIUM 40 MG/0.4ML ~~LOC~~ SOLN
40.0000 mg | SUBCUTANEOUS | Status: DC
  Administered 2019-06-18 – 2019-06-20 (×3): 40 mg via SUBCUTANEOUS
  Filled 2019-06-17 (×3): qty 0.4

## 2019-06-17 MED ORDER — ALBUTEROL SULFATE HFA 108 (90 BASE) MCG/ACT IN AERS
2.0000 | INHALATION_SPRAY | Freq: Four times a day (QID) | RESPIRATORY_TRACT | Status: DC
  Administered 2019-06-17 – 2019-06-21 (×15): 2 via RESPIRATORY_TRACT
  Filled 2019-06-17: qty 6.7

## 2019-06-17 MED ORDER — ACETAMINOPHEN 325 MG PO TABS
650.0000 mg | ORAL_TABLET | Freq: Four times a day (QID) | ORAL | Status: DC | PRN
  Administered 2019-06-17: 650 mg via ORAL
  Filled 2019-06-17: qty 2

## 2019-06-17 NOTE — H&P (Addendum)
History and Physical    Tim Hunt ZOX:[id number] DOB: 09/30/1965 DOA: 06/17/2019  PCP: Patient, No Pcp Per   Patient coming from: Home.   I have personally briefly reviewed patient's old medical [facility]  Chief Complaint: sob and cough.   HPI: Tim CaffeyMariano Batzel is a 53 y.o. male with medical history significant of Hypertension, OSA not on CPAP, hyperlipidemia, presents to ED with worsening sob, associated with cough and fevers. As per the patient his symptoms started on 11/30 with fever and sob, he got tested on 12/3 and was found to be COVID 19 positive. But over the last 24 hours pt became increasingly dyspneic, associated with cough, fevers, chest tightness, with some nausea and diarrhea. The diarrhea appears to have improved. He denies any hematochezia, headache, dizziness or hemoptysis.  ED Course: on arrival to ED he was found to be febrile, tachycardic, tachypnea, hypoxic requiring up to 4 lit of [place] oxygen to keep sats greater than 92%. Labs revealed sodium of 138, creatinine of 1.2, potassium of 4.2, glucose of 112, AST of 106, ALT OF 168, LDH OF 360, ferritin of 642, CRP OF 12. 9, LACTIC acid of 1.3, pro calcitonin of 0.10, d dimer of 1.22, fibrinogen of 605, COVID 19 positive. Blood cultures are drawn.  CXR showed Multiple lung opacities are noted bilaterally concerning for multifocal pneumonia, potentially of viral etiology.   [facility].   Review of Systems: As per HPI otherwise "All others reviewed and are negative," .   Past Medical History:  Diagnosis Date  . Blood dyscrasia    "red and white blood cells run low"  . Hypercholesterolemia   . Hypertension   . Sinusitis, chronic   . Sleep apnea    does not use CPAP anymore    Past Surgical History:  Procedure Laterality Date  . HERNIA REPAIR     umbilical  . KNEE SURGERY     right knee surgery  . NASAL SEPTOPLASTY W/ TURBINOPLASTY Bilateral  04/16/2017   Procedure: NASAL SEPTOPLASTY WITH BILATERAL INFERIOR TURBINATE REDUCTION;  Surgeon: Osborn CohoShoemaker, David, MD;  [facility];  Service: ENT;  Laterality: Bilateral;     reports that he has never smoked. He has never used smokeless tobacco. He reports current alcohol use. He reports that he does not use drugs.  No Known Allergies Family history: Family history negative for CAD.    Prior to Admission medications   Medication Sig Start Date End Date Taking? Authorizing Provider  acetaminophen (TYLENOL) 500 MG tablet Take 1,000 mg by mouth every 6 (six) hours as needed for mild pain or fever.   Yes [provider]  acyclovir (ZOVIRAX) 400 MG tablet Take 400 mg by mouth 2 (two) times daily. 01/08/19  Yes [provider]  amLODipine (NORVASC) 10 MG tablet Take 10 mg by mouth daily.    Yes [provider]  guaiFENesin-dextromethorphan (ROBITUSSIN DM) 100-10 MG/5ML syrup Take 5 mLs by mouth every 4 (four) hours as needed for cough.   Yes [provider]  ibuprofen (ADVIL) 400 MG tablet Take 400 mg by mouth every 6 (six) hours as needed for fever or moderate pain.   Yes [provider]  rosuvastatin (CRESTOR) 20 MG tablet Take 20 mg by mouth every evening.    Yes [provider]  amoxicillin-clavulanate (AUGMENTIN) 500-125 MG tablet Take 1 tablet (500 mg total) by mouth 2 (two) times daily. Patient not taking: Reported  on 06/17/2019 04/16/17   Osborn Coho, MD  HYDROcodone-acetaminophen (NORCO) 5-325 MG tablet Take 1-2 tablets by mouth every 6 (six) hours as needed. Patient not taking: Reported on 06/17/2019 04/16/17   Osborn Coho, MD    Physical Exam: Vitals:   06/17/19 1530 06/17/19 1545 06/17/19 1600 06/17/19 1700  BP: (!) 157/89  (!) 163/96 (!) 141/88  Pulse: 96 97 99 (!) 106  Resp: (!) 38 (!) 32 (!) 32 13  Temp:      TempSrc:      SpO2: 95% 95% 93% 95%  Weight:      Height:        Constitutional: mod distress from  sob. On 4 lit of [place] oxygen.  Vitals:   06/17/19 1530 06/17/19 1545 06/17/19 1600 06/17/19 1700  BP: (!) 157/89  (!) 163/96 (!) 141/88  Pulse: 96 97 99 (!) 106  Resp: (!) 38 (!) 32 (!) 32 13  Temp:      TempSrc:      SpO2: 95% 95% 93% 95%  Weight:      Height:       Eyes: PERRL, lids and conjunctivae normal ENMT: Mucous membranes are dry.  Respiratory: bilateral rhonchi at bases, tachypnea,   Cardiovascular:tachycardic, S1S2, no murmer.  Abdomen: no tenderness, no masses palpated.abd is soft, non distended. Bowel sounds positive.  Musculoskeletal: no clubbing / cyanosis. Skin: no rashes, lesions, ulcers. No induration Neurologic: CN 2-12 grossly intact.  Strength 5/5 in all 4.  Psychiatric: very anxious.    Labs on Admission: I have personally reviewed following labs and imaging studies  CBC: Recent Labs  Lab 06/17/19 1350  WBC 6.8  NEUTROABS 5.6  HGB 13.1  HCT 43.0  MCV 74.3*  PLT 197   Basic Metabolic Panel: Recent Labs  Lab 06/17/19 1350  NA 138  K 4.2  CL 99  CO2 26  GLUCOSE 112*  BUN 14  CREATININE 1.20  CALCIUM 8.7*   GFR: Estimated Creatinine Clearance: 80 mL/min (by C-G formula based on SCr of 1.2 mg/dL). Liver Function Tests: Recent Labs  Lab 06/17/19 1350  AST 106*  ALT 168*  ALKPHOS 49  BILITOT 0.5  PROT 8.0  ALBUMIN 3.7   No results for input(s): LIPASE, AMYLASE in the last 168 hours. No results for input(s): AMMONIA in the last 168 hours. Coagulation Profile: No results for input(s): INR, PROTIME in the last 168 hours. Cardiac Enzymes: No results for input(s): CKTOTAL, CKMB, CKMBINDEX, TROPONINI in the last 168 hours. BNP (last 3 results) No results for input(s): PROBNP in the last 8760 hours. HbA1C: No results for input(s): HGBA1C in the last 72 hours. CBG: No results for input(s): GLUCAP in the last 168 hours. Lipid Profile: Recent Labs    06/17/19 1350  TRIG 81   Thyroid Function Tests: No results for input(s): TSH,  T4TOTAL, FREET4, T3FREE, THYROIDAB in the last 72 hours. Anemia Panel: Recent Labs    06/17/19 1350  FERRITIN 642*   Urine analysis: No results found for: COLORURINE, APPEARANCEUR, LABSPEC, PHURINE, GLUCOSEU, HGBUR, BILIRUBINUR, KETONESUR, PROTEINUR, UROBILINOGEN, NITRITE, LEUKOCYTESUR  Radiological Exams on Admission: Dg Chest Port 1 View  Result Date: 06/17/2019 CLINICAL DATA:  Shortness of breath, COVID-19 positive. EXAM: PORTABLE CHEST 1 VIEW COMPARISON:  None. FINDINGS: The heart size and mediastinal contours are within normal limits. Multiple ill-defined airspace opacities are noted in both lungs, right greater than left. The visualized skeletal structures are unremarkable. IMPRESSION: Multiple lung opacities are noted bilaterally concerning for multifocal pneumonia,  potentially of viral etiology. Electronically Signed   By: Marijo Conception M.D.   On: 06/17/2019 15:23    RKY:HCWCB rhythm.   Assessment/Plan Active Problems:   COVID-19 virus infection    Fever, sob, hypoxia COVID 19 positive, with CXR showing bil pneuonia.  Admit for COVID 19 viral illness.  Start him on decadron 6 mg IV daily .  Discussed Actemra with the patient and started him on Actemra.  [place] oxygen as needed to keep sats greater than 90%.  Transfer the patient to [facility] when bed is available.    Hypertension:  Well controlled BP parameters.    Mild elevation of liver enzymes:  Probably viral etiology.  Continue to monitor.    Severity of Illness: The appropriate patient status for this patient is INPATIENT. Inpatient status is judged to be reasonable and necessary in order to provide the required intensity of service to ensure the patient's safety. The patient's presenting symptoms, physical exam findings, and initial radiographic and laboratory data in the context of their chronic comorbidities is felt to place them at high risk for further clinical deterioration. Furthermore, it is not anticipated that  the patient will be medically stable for discharge from the hospital within 2 midnights of admission.    * I certify that at the point of admission it is my clinical judgment that the patient will require inpatient hospital care spanning beyond 2 midnights from the point of admission due to high intensity of service, high risk for further deterioration and high frequency of surveillance required.*    DVT prophylaxis: lovenox.  Code Status: full code.  Family Communication: none at bedside, discussed the plan to transfer to [facility].  Disposition Plan: pending clinical improvement.  Consults called: none.  Admission status: inpatient/ stepdown.    Hosie Poisson MD [facility]   06/17/2019, 6:06 PM

## 2019-06-17 NOTE — ED Provider Notes (Signed)
Received patient as a handoff from [employer], PA-C.  Patient presents to the ED with a 9-day history of shortness of breath, cough, mixed, and fevers.  Presents today given worsening shortness of breath symptoms and chest tightness.  Endorses nausea and diminished appetite.  Patient reportedly had positive COVID-19 testing at a [place] recently.  Patient was able to understand and speak [name], although Spanish was provided.  I conducted a brief exam as hospitalist had already been consulted and has agreed to admit the patient.  Physical Exam  BP (!) 163/96   Pulse 99   Temp (!) 101.7 F (38.7 C) (Oral)   Resp (!) 32   Ht 6' 2"  (1.88 m)   Wt 79.4 kg   SpO2 93%   BMI 22.47 kg/m   Physical Exam Vitals signs and nursing note reviewed. Exam conducted with a chaperone present.  Constitutional:      Appearance: Normal appearance.  HENT:     Head: Normocephalic and atraumatic.  Eyes:     General: No scleral icterus.    Conjunctiva/sclera: Conjunctivae normal.  Cardiovascular:     Rate and Rhythm: Normal rate and regular rhythm.     Pulses: Normal pulses.     Heart sounds: Normal heart sounds.  Pulmonary:     Comments: Tachypneic.  Increased work of breathing.  Breath sounds intact bilaterally. Abdominal:     General: Abdomen is flat. There is no distension.     Palpations: Abdomen is soft.     Tenderness: There is no abdominal tenderness. There is no guarding.  Musculoskeletal:     Right lower leg: No edema.     Left lower leg: No edema.  Skin:    General: Skin is dry.  Neurological:     Mental Status: He is alert.     GCS: GCS eye subscore is 4. GCS verbal subscore is 5. GCS motor subscore is 6.  Psychiatric:        Mood and Affect: Mood normal.        Behavior: Behavior normal.        Thought Content: Thought content normal.      ED Course/Procedures     Procedures  Results for orders placed or performed during the hospital encounter of 06/17/19   Lactic acid, plasma  Result Value Ref Range   Lactic Acid, Venous 1.3 0.5 - 1.9 mmol/L  CBC WITH DIFFERENTIAL  Result Value Ref Range   WBC 6.8 4.0 - 10.5 K/uL   RBC 5.79 4.22 - 5.81 MIL/uL   Hemoglobin 13.1 13.0 - 17.0 g/dL   HCT 43.0 39.0 - 52.0 %   MCV 74.3 (L) 80.0 - 100.0 fL   MCH 22.6 (L) 26.0 - 34.0 pg   MCHC 30.5 30.0 - 36.0 g/dL   RDW 15.4 11.5 - 15.5 %   Platelets 197 150 - 400 K/uL   nRBC 0.0 0.0 - 0.2 %   Neutrophils Relative % 83 %   Neutro Abs 5.6 1.7 - 7.7 K/uL   Lymphocytes Relative 14 %   Lymphs Abs 0.9 0.7 - 4.0 K/uL   Monocytes Relative 3 %   Monocytes Absolute 0.2 0.1 - 1.0 K/uL   Eosinophils Relative 0 %   Eosinophils Absolute 0.0 0.0 - 0.5 K/uL   Basophils Relative 0 %   Basophils Absolute 0.0 0.0 - 0.1 K/uL   Immature Granulocytes 0 %   Abs Immature Granulocytes 0.03 0.00 - 0.07 K/uL  Comprehensive metabolic panel  Result  Value Ref Range   Sodium 138 135 - 145 mmol/L   Potassium 4.2 3.5 - 5.1 mmol/L   Chloride 99 98 - 111 mmol/L   CO2 26 22 - 32 mmol/L   Glucose, Bld 112 (H) 70 - 99 mg/dL   BUN 14 6 - 20 mg/dL   Creatinine, Ser 1.20 0.61 - 1.24 mg/dL   Calcium 8.7 (L) 8.9 - 10.3 mg/dL   Total Protein 8.0 6.5 - 8.1 g/dL   Albumin 3.7 3.5 - 5.0 g/dL   AST 106 (H) 15 - 41 U/L   ALT 168 (H) 0 - 44 U/L   Alkaline Phosphatase 49 38 - 126 U/L   Total Bilirubin 0.5 0.3 - 1.2 mg/dL   GFR calc non Af Amer >60 >60 mL/min   GFR calc Af Amer >60 >60 mL/min   Anion gap 13 5 - 15  D-dimer, quantitative  Result Value Ref Range   D-Dimer, Quant 1.22 (H) 0.00 - 0.50 ug/mL-FEU  Procalcitonin  Result Value Ref Range   Procalcitonin <0.10 ng/mL  Lactate dehydrogenase  Result Value Ref Range   LDH 360 (H) 98 - 192 U/L  Ferritin  Result Value Ref Range   Ferritin 642 (H) 24 - 336 ng/mL  Triglycerides  Result Value Ref Range   Triglycerides 81 <150 mg/dL  Fibrinogen  Result Value Ref Range   Fibrinogen 605 (H) 210 - 475 mg/dL  C-reactive protein   Result Value Ref Range   CRP 12.9 (H) <1.0 mg/dL  POC SARS Coronavirus 2 Ag-ED - Nasal Swab (BD Veritor Kit)  Result Value Ref Range   SARS Coronavirus 2 Ag POSITIVE (A) NEGATIVE   Dg Chest Port 1 View  Result Date: 06/17/2019 CLINICAL DATA:  Shortness of breath, COVID-19 positive. EXAM: PORTABLE CHEST 1 VIEW COMPARISON:  None. FINDINGS: The heart size and mediastinal contours are within normal limits. Multiple ill-defined airspace opacities are noted in both lungs, right greater than left. The visualized skeletal structures are unremarkable. IMPRESSION: Multiple lung opacities are noted bilaterally concerning for multifocal pneumonia, potentially of viral etiology. Electronically Signed   By: Marijo Conception M.D.   On: 06/17/2019 15:23     MDM   Received patient as a handoff from [employer], PA-C. Consult with hospitalist had already been ordered. Received phone call from Dr. Karleen Hampshire, admitting physician, before I was able to thoroughly examine patient. However, my brief encounter is consistent with what Alecia Lemming, PA-C reports.  DG chest demonstrates multiple opacities concerning for bilateral multifocal pneumonia.  Rapid COVID-19 testing is positive.  Patient ambulated on pulse ox and dizzy, weak, and short of breath during ambulation.  Maintained 91% on room air, however multiple documented notes of patient dropping below 90% on room air while resting comfortably on bed.  While patient endorses chest tightness with inspiration, Geiple PA-C has low suspicion for pulmonary embolism and I agree with his assessment given large pneumonia.  Patient also reports that this was worsening rather than sudden onset.  D-dimer is elevated, but is elevated most COVID-19 patients and do not believe that it warrants CTA at this time.  I agree that patient warrants admission for new onset hypoxia in context of COVID-19 pneumonia.  Spoke with hospitalist who will admit patient.      Corena Herter,  PA-C 06/17/19 [place], [place], DO 06/17/19 [phone]

## 2019-06-17 NOTE — Progress Notes (Signed)
Pt was admitted with COVID and tx here. He got Actemra at [facility]. Remdesivir is ordered here. He does have elevated ALT at baseline but still meets criteria for both.   Remdesivir 200mg  IV x1 then 100mg  IV qday x4 F/u with ALT  Onnie Boer, PharmD, BCIDP, AAHIVP, CPP Infectious Disease Pharmacist 06/17/2019 9:20 PM

## 2019-06-17 NOTE — ED Notes (Signed)
Patient o2 sat 89% on RA, applied [place] and set on 4L, patient sat increased to 98%. ED EKG COMPLETED BY WRITER

## 2019-06-17 NOTE — ED Notes (Signed)
Sp02 91 during ambulation room air, Pt states he felt weak dizzy and SOB.

## 2019-06-17 NOTE — ED Notes (Signed)
Report given to [facility]. 

## 2019-06-17 NOTE — ED Notes (Signed)
[facility] at bedside 

## 2019-06-17 NOTE — ED Provider Notes (Signed)
[place] DEPT Provider Note   CSN: [id number] Arrival date & time: 06/17/19  1207     History   Chief Complaint Chief Complaint  Patient presents with  . Shortness of Breath    HPI Tim Hunt is a 53 y.o. male.     Patient presents the emergency department with worsening shortness of breath, cough, headaches, fevers starting on 06/09/2019.  Today is day 9 of illness.  Patient has had ongoing intermittent fevers.  Presents today due to worsening shortness of breath and chest tightness.  He has had some nausea and decreased appetite but no vomiting.  He had 1 day of diarrhea however that is now resolved.  No urinary symptoms or skin rash.  No sore throat.  Decreased taste.  States that he was tested at [facility] for coronavirus and had positive result.  His fiance is also sick.  The onset of this condition was acute. The course is constant. Aggravating factors: activity. Alleviating factors: none.       Past Medical History:  Diagnosis Date  . Blood dyscrasia    "red and white blood cells run low"  . Hypercholesterolemia   . Hypertension   . Sinusitis, chronic   . Sleep apnea    does not use CPAP anymore    Patient Active Problem List   Diagnosis Date Noted  . Deviated septum 04/16/2017  . Obstructive sleep apnea 04/16/2017    Past Surgical History:  Procedure Laterality Date  . HERNIA REPAIR     umbilical  . KNEE SURGERY     right knee surgery  . NASAL SEPTOPLASTY W/ TURBINOPLASTY Bilateral 04/16/2017   Procedure: NASAL SEPTOPLASTY WITH BILATERAL INFERIOR TURBINATE REDUCTION;  Surgeon: Jerrell Belfast, MD;  Location: [place];  Service: ENT;  Laterality: Bilateral;        Home Medications    Prior to Admission medications   Medication Sig Start Date End Date Taking? Authorizing Provider  amLODipine (NORVASC) 5 MG tablet Take 5 mg by mouth daily.    [provider]  amoxicillin-clavulanate (AUGMENTIN) 500-125 MG  tablet Take 1 tablet (500 mg total) by mouth 2 (two) times daily. 04/16/17   Jerrell Belfast, MD  HYDROcodone-acetaminophen (NORCO) 5-325 MG tablet Take 1-2 tablets by mouth every 6 (six) hours as needed. 04/16/17   Jerrell Belfast, MD  rosuvastatin (CRESTOR) 20 MG tablet Take 20 mg by mouth every morning.    [provider]    Family History No family history on file.  Social History Social History   Tobacco Use  . Smoking status: Never Smoker  . Smokeless tobacco: Never Used  Substance Use Topics  . Alcohol use: Yes    Comment: beer occasionally   . Drug use: No     Allergies   Patient has no known allergies.   Review of Systems Review of Systems  Constitutional: Positive for appetite change, chills and fever.  HENT: Negative for rhinorrhea and sore throat.   Eyes: Negative for redness.  Respiratory: Positive for cough, chest tightness and shortness of breath.   Cardiovascular: Negative for chest pain.  Gastrointestinal: Positive for diarrhea and nausea. Negative for abdominal pain and vomiting.  Genitourinary: Negative for dysuria.  Musculoskeletal: Positive for myalgias.  Skin: Negative for rash.  Neurological: Positive for headaches.     Physical Exam Updated Vital Signs BP (!) 166/94 (BP Location: Left Arm)   Pulse 99   Temp (!) 101.7 F (38.7 C) (Oral)   Resp (!) 25  Ht 6\' 2"  (1.88 m)   Wt 79.4 kg   SpO2 92%   BMI 22.47 kg/m   Physical Exam Vitals signs and nursing note reviewed.  Constitutional:      Appearance: He is well-developed.  HENT:     Head: Normocephalic and atraumatic.  Eyes:     General:        Right eye: No discharge.        Left eye: No discharge.     Conjunctiva/sclera: Conjunctivae normal.  Neck:     Musculoskeletal: Normal range of motion and neck supple.  Cardiovascular:     Rate and Rhythm: Normal rate and regular rhythm.     Heart sounds: Normal heart sounds.  Pulmonary:     Effort: Pulmonary effort is normal.  Tachypnea present.     Breath sounds: Rhonchi (scattered) present. No decreased breath sounds, wheezing or rales.  Abdominal:     Palpations: Abdomen is soft.     Tenderness: There is no abdominal tenderness.  Musculoskeletal:     Right lower leg: He exhibits no tenderness. No edema.     Left lower leg: He exhibits no tenderness. No edema.  Skin:    General: Skin is warm and dry.  Neurological:     Mental Status: He is alert.      ED Treatments / Results  Labs (all labs ordered are listed, but only abnormal results are displayed) Labs Reviewed - No data to display  EKG EKG Interpretation  Date/Time:  Tuesday June 17 2019 12:41:50 EST Ventricular Rate:  97 PR Interval:    QRS Duration: 98 QT Interval:  337 QTC Calculation: 428 R Axis:   70 Text Interpretation: Sinus rhythm Borderline T wave abnormalities Confirmed by [id number] [phone]) on 06/17/2019 2:34:55 PM   Radiology Dg Chest Port 1 View  Result Date: 06/17/2019 CLINICAL DATA:  Shortness of breath, COVID-19 positive. EXAM: PORTABLE CHEST 1 VIEW COMPARISON:  None. FINDINGS: The heart size and mediastinal contours are within normal limits. Multiple ill-defined airspace opacities are noted in both lungs, right greater than left. The visualized skeletal structures are unremarkable. IMPRESSION: Multiple lung opacities are noted bilaterally concerning for multifocal pneumonia, potentially of viral etiology. Electronically Signed   By: 14/02/2019 M.D.   On: 06/17/2019 15:23    Procedures Procedures (including critical care time)  Medications Ordered in ED Medications - No data to display   Initial Impression / Assessment and Plan / ED Course  I have reviewed the triage vital signs and the nursing notes.  Pertinent labs & imaging results that were available during my care of the patient were reviewed by me and considered in my medical decision making (see chart for details).        Patient seen and  examined. During exam O2 to 92% on RA. 89% noted on arrival.  Work-up initiated. Will also check pulse ox on exertion.   Vital signs reviewed and are as follows: BP (!) 166/94 (BP Location: Left Arm)   Pulse 99   Temp (!) 101.7 F (38.7 C) (Oral)   Resp (!) 25   Ht 6\' 2"  (1.88 m)   Wt 79.4 kg   SpO2 92%   BMI 22.47 kg/m   Patient ambulated with pulse ox in the low 90s, was very short of breath.  On reexam, patient had taken off his nasal cannula.  Upon entering the room oxygen level was 88% at rest on room air.  Reviewed with patient findings  to this point.  Discussed that he would likely benefit from being in the hospital for further treatment of COVID-19 pneumonia.  Discussed that he is at very high risk of getting worse if he went home today.  He is in agreement with admission.  Signed out to Pitney Bowesreen PA-C at shift change who will speak with hospitalist regarding admission.   Final Clinical Impressions(s) / ED Diagnoses   Final diagnoses:  COVID-19   Admit for COVID-19 pneumonia.  ED Discharge Orders    None       Renne CriglerGeiple, Wednesday Ericsson, Cordelia Poche-C 06/18/19 0901    Raeford RazorKohut, Stephen, MD 06/18/19 [phone]

## 2019-06-17 NOTE — ED Triage Notes (Signed)
Per patient, he tested positive for covid on 06/12/19, says he had a fever of 104 that day. Today he is experiencing worsening Shobr and chest pain.

## 2019-06-18 ENCOUNTER — Encounter (HOSPITAL_COMMUNITY): Payer: Self-pay | Admitting: Emergency Medicine

## 2019-06-18 DIAGNOSIS — U071 COVID-19: Principal | ICD-10-CM

## 2019-06-18 LAB — CBC WITH DIFFERENTIAL/PLATELET
Abs Immature Granulocytes: 0.04 10*3/uL (ref 0.00–0.07)
Basophils Absolute: 0 10*3/uL (ref 0.0–0.1)
Basophils Relative: 0 %
Eosinophils Absolute: 0 10*3/uL (ref 0.0–0.5)
Eosinophils Relative: 0 %
HCT: 40.8 % (ref 39.0–52.0)
Hemoglobin: 12.6 g/dL — ABNORMAL LOW (ref 13.0–17.0)
Immature Granulocytes: 1 %
Lymphocytes Relative: 11 %
Lymphs Abs: 0.8 10*3/uL (ref 0.7–4.0)
MCH: 22.7 pg — ABNORMAL LOW (ref 26.0–34.0)
MCHC: 30.9 g/dL (ref 30.0–36.0)
MCV: 73.6 fL — ABNORMAL LOW (ref 80.0–100.0)
Monocytes Absolute: 0.2 10*3/uL (ref 0.1–1.0)
Monocytes Relative: 2 %
Neutro Abs: 6.5 10*3/uL (ref 1.7–7.7)
Neutrophils Relative %: 86 %
Platelets: 216 10*3/uL (ref 150–400)
RBC: 5.54 MIL/uL (ref 4.22–5.81)
RDW: 15.6 % — ABNORMAL HIGH (ref 11.5–15.5)
WBC: 7.5 10*3/uL (ref 4.0–10.5)
nRBC: 0 % (ref 0.0–0.2)

## 2019-06-18 LAB — PHOSPHORUS: Phosphorus: 3.5 mg/dL (ref 2.5–4.6)

## 2019-06-18 LAB — C-REACTIVE PROTEIN: CRP: 17.1 mg/dL — ABNORMAL HIGH (ref <1.0)

## 2019-06-18 LAB — COMPREHENSIVE METABOLIC PANEL
ALT: 200 U/L — ABNORMAL HIGH (ref 0–44)
AST: 132 U/L — ABNORMAL HIGH (ref 15–41)
Albumin: 3.6 g/dL (ref 3.5–5.0)
Alkaline Phosphatase: 54 U/L (ref 38–126)
Anion gap: 14 (ref 5–15)
BUN: 16 mg/dL (ref 6–20)
CO2: 24 mmol/L (ref 22–32)
Calcium: 8.7 mg/dL — ABNORMAL LOW (ref 8.9–10.3)
Chloride: 99 mmol/L (ref 98–111)
Creatinine, Ser: 0.97 mg/dL (ref 0.61–1.24)
GFR calc Af Amer: >60 mL/min (ref >60)
GFR calc non Af Amer: >60 mL/min (ref >60)
Glucose, Bld: 172 mg/dL — ABNORMAL HIGH (ref 70–99)
Potassium: 4.1 mmol/L (ref 3.5–5.1)
Sodium: 137 mmol/L (ref 135–145)
Total Bilirubin: 0.4 mg/dL (ref 0.3–1.2)
Total Protein: 8 g/dL (ref 6.5–8.1)

## 2019-06-18 LAB — MAGNESIUM: Magnesium: 2.3 mg/dL (ref 1.7–2.4)

## 2019-06-18 LAB — D-DIMER, QUANTITATIVE: D-Dimer, Quant: 1.08 ug/mL-FEU — ABNORMAL HIGH (ref 0.00–0.50)

## 2019-06-18 LAB — FERRITIN: Ferritin: 803 ng/mL — ABNORMAL HIGH (ref 24–336)

## 2019-06-18 LAB — ABO/RH: ABO/RH(D): A POS

## 2019-06-18 LAB — HEPATITIS B SURFACE ANTIGEN: Hepatitis B Surface Ag: NONREACTIVE

## 2019-06-18 MED ORDER — SALINE SPRAY 0.65 % NA SOLN
1.0000 | NASAL | Status: DC | PRN
  Administered 2019-06-19 – 2019-06-21 (×3): 1 via NASAL
  Filled 2019-06-18: qty 44

## 2019-06-18 MED ORDER — OLOPATADINE HCL 0.1 % OP SOLN
1.0000 [drp] | Freq: Two times a day (BID) | OPHTHALMIC | Status: DC
  Administered 2019-06-18 – 2019-06-21 (×7): 1 [drp] via OPHTHALMIC
  Filled 2019-06-18: qty 5

## 2019-06-18 MED ORDER — DEXAMETHASONE SODIUM PHOSPHATE 10 MG/ML IJ SOLN
6.0000 mg | Freq: Two times a day (BID) | INTRAMUSCULAR | Status: DC
  Administered 2019-06-18 – 2019-06-19 (×4): 6 mg via INTRAVENOUS
  Filled 2019-06-18 (×4): qty 1

## 2019-06-18 NOTE — Plan of Care (Signed)
  Problem: Education: Goal: Knowledge of risk factors and measures for prevention of condition will improve Outcome: Progressing   Problem: Coping: Goal: Psychosocial and spiritual needs will be supported Outcome: Progressing   Problem: Respiratory: Goal: Will maintain a patent airway Outcome: Progressing Goal: Complications related to the disease process, condition or treatment will be avoided or minimized Outcome: Progressing   

## 2019-06-18 NOTE — Progress Notes (Signed)
A/O, ot noted to be anxious during admission. 2l N/C, sleep apnea. Skin intact. Educated on usage incentive. Denies blood in sputum. pt slept well during the night. Called friend to update on pt's status.

## 2019-06-18 NOTE — Progress Notes (Addendum)
PROGRESS NOTE  Pacey Altizer  ZMO:[id number] DOB: [birth year] DOA: 06/17/2019 PCP: Patient, No Pcp Per  Brief Narrative: Ahyan Kreeger is a 53 y.o. male with a history of HTN, HLD, OSA not on CPAP who developed symptoms 11/30 including cough and fever, tested positive for covid-19 on 12/3, and presented to the ED 12/8 for progressive shortness of breath. In the ED he was febrile, tachycardic, tachypneic, and hypoxic requiring oxygen by nasal cannula. CRP elevated 12.9, PCT normal, d-dimer 1.22, and CXR demonstrated bilateral opacities consistent with covid-19 pneumonia. He was admitted to [facility] started on remdesivir, decadron, and a dose of tocilizumab administered.   Assessment & Plan: Active Problems:   COVID-19 virus infection  Acute hypoxic respiratory failure due to covid-19 pneumonia: Worsening. - Continue airborne, contact precautions. PPE including surgical gown, gloves, cap, shoe covers, and CAPR used during this encounter in a negative pressure room.  - Check daily labs: CBC w/diff, CMP, d-dimer, ferritin, CRP - Maintain euvolemia/net negative.  - Avoid NSAIDs - Recommend proning and aggressive use of incentive spirometry. - Continue remdesivir (12/8 - 12/12) Will need close monitoring of LFTs (ALT 168 > 200). - s/p tocilizumab 12/8.  - CRP trending upward. Will augment steroid dosing to decadron 6mg  q12h. Continue trending.   ALT elevation: Viral effect +/- remdesivir. Worsening - Monitor daily. - Avoid hepatotoxins, may have to stop remdesivir pending AM levels. Note bilirubin and AP wnl.  Conjunctivitis:  - gtt's  Epistaxis:  - Nasal saline, continue to monitor hgb, 12.6g/dl currently.  HTN:  - BP normotensive while holding norvasc  OSA: Not on CPAP at home.  - qHS O2  HLD:  - Stop statin with elevated LFTs.  DVT prophylaxis: Lovenox Code Status: Full Family Communication: None at bedside Disposition Plan: Home once improving.  Consultants:   None   Procedures:   None  Antimicrobials:  Remdesivir   Subjective: Spanish video [phone], used throughout encounter. No change in shortness of breath.   Objective: Vitals:   06/18/19 0000 06/18/19 0512 06/18/19 0730 06/18/19 1115  BP:  123/76 123/79 114/74  Pulse:   81 87  Resp:   (!) 27 (!) 32  Temp: 98.3 F (36.8 C) (!) 97.5 F (36.4 C) 98.5 F (36.9 C) 100 F (37.8 C)  TempSrc: Oral Oral Oral Oral  SpO2:   93% 91%  Weight:      Height:        Intake/Output Summary (Last 24 hours) at 06/18/2019 1331 Last data filed at 06/18/2019 0300 Gross per 24 hour  Intake 100 ml  Output -  Net 100 ml   Filed Weights   06/17/19 1238  Weight: 79.4 kg    Gen: 53 y.o. male in no distress  Pulm: Non-labored breathing supplemental oxygen. Diminished with scant crackles bilaterally.  CV: Regular rate and rhythm. No murmur, rub, or gallop. No JVD, no pedal edema. GI: Abdomen soft, non-tender, non-distended, with normoactive bowel sounds. No organomegaly or masses felt. Ext: Warm, no deformities Skin: No rashes, lesions or ulcers Neuro: Alert and oriented. No focal neurological deficits. Psych: Judgement and insight appear normal. Mood anxious & affect appropriate.   Data Reviewed: I have personally reviewed following labs and imaging studies  CBC: Recent Labs  Lab 06/17/19 1350 06/18/19 0500  WBC 6.8 7.5  NEUTROABS 5.6 6.5  HGB 13.1 12.6*  HCT 43.0 40.8  MCV 74.3* 73.6*  PLT 197 216   Basic Metabolic Panel: Recent Labs  Lab 06/17/19 1350 06/18/19 0500  NA 138 137  K 4.2 4.1  CL 99 99  CO2 26 24  GLUCOSE 112* 172*  BUN 14 16  CREATININE 1.20 0.97  CALCIUM 8.7* 8.7*  MG  --  2.3  PHOS  --  3.5   GFR: Estimated Creatinine Clearance: 98.9 mL/min (by C-G formula based on SCr of 0.97 mg/dL). Liver Function Tests: Recent Labs  Lab 06/17/19 1350 06/18/19 0500  AST 106* 132*  ALT 168* 200*  ALKPHOS 49 54  BILITOT 0.5 0.4  PROT 8.0 8.0   ALBUMIN 3.7 3.6   No results for input(s): LIPASE, AMYLASE in the last 168 hours. No results for input(s): AMMONIA in the last 168 hours. Coagulation Profile: No results for input(s): INR, PROTIME in the last 168 hours. Cardiac Enzymes: No results for input(s): CKTOTAL, CKMB, CKMBINDEX, TROPONINI in the last 168 hours. BNP (last 3 results) No results for input(s): PROBNP in the last 8760 hours. HbA1C: No results for input(s): HGBA1C in the last 72 hours. CBG: No results for input(s): GLUCAP in the last 168 hours. Lipid Profile: Recent Labs    06/17/19 1350  TRIG 81   Thyroid Function Tests: No results for input(s): TSH, T4TOTAL, FREET4, T3FREE, THYROIDAB in the last 72 hours. Anemia Panel: Recent Labs    06/17/19 1350 06/18/19 0500  FERRITIN 642* 803*   Urine analysis: No results found for: COLORURINE, APPEARANCEUR, LABSPEC, PHURINE, GLUCOSEU, HGBUR, BILIRUBINUR, KETONESUR, PROTEINUR, UROBILINOGEN, NITRITE, LEUKOCYTESUR Recent Results (from the past 240 hour(s))  Blood Culture (routine x 2)     Status: None (Preliminary result)   Collection Time: 06/17/19  1:07 PM   Specimen: BLOOD  Result Value Ref Range Status   Specimen Description   Final    BLOOD RIGHT ANTECUBITAL Performed at [facility] [address] [place], [place] [address]    Special Requests   Final    BOTTLES DRAWN AEROBIC AND ANAEROBIC Blood Culture adequate volume Performed at [facility] [address], [place], [place] [address]    Culture   Final    NO GROWTH < 24 HOURS Performed at [facility] [address], [place], [place] [address]    Report Status PENDING  Incomplete  Blood Culture (routine x 2)     Status: None (Preliminary result)   Collection Time: 06/17/19  1:50 PM   Specimen: BLOOD  Result Value Ref Range Status   Specimen Description   Final    BLOOD LEFT ANTECUBITAL Performed at [facility] [address] [place], [place] [address]    Special Requests   Final    BOTTLES DRAWN AEROBIC AND ANAEROBIC Blood Culture adequate volume Performed at [facility] [address] [place], [place] [address]    Culture   Final    NO GROWTH < 24 HOURS Performed at [facility] [address] [place], [place] [address]    Report Status PENDING  Incomplete      Radiology Studies: Dg Chest Port 1 View  Result Date: 06/17/2019 CLINICAL DATA:  Shortness of breath, COVID-19 positive. EXAM: PORTABLE CHEST 1 VIEW COMPARISON:  None. FINDINGS: The heart size and mediastinal contours are within normal limits. Multiple ill-defined airspace opacities are noted in both lungs, right greater than left. The visualized skeletal structures are unremarkable. IMPRESSION: Multiple lung opacities are noted bilaterally concerning for multifocal pneumonia, potentially of viral etiology. Electronically Signed   By: Bobbe Medico.D.  On: 06/17/2019 15:23    Scheduled Meds: . albuterol  2 puff Inhalation Q6H  . dexamethasone (DECADRON) injection  6 mg Intravenous Q24H  . enoxaparin (LOVENOX) injection  40 mg Subcutaneous Q24H  . olopatadine  1 drop Both Eyes BID   Continuous Infusions: . remdesivir 100 mg in NS 100 mL       LOS: 1 day   Time spent: 35 minutes.  Tyrone Nineyan B Dava Rensch, MD [facility] [email or web address] 06/18/2019, 1:31 PM

## 2019-06-19 LAB — COMPREHENSIVE METABOLIC PANEL
ALT: 191 U/L — ABNORMAL HIGH (ref 0–44)
AST: 111 U/L — ABNORMAL HIGH (ref 15–41)
Albumin: 3.5 g/dL (ref 3.5–5.0)
Alkaline Phosphatase: 53 U/L (ref 38–126)
Anion gap: 12 (ref 5–15)
BUN: 27 mg/dL — ABNORMAL HIGH (ref 6–20)
CO2: 25 mmol/L (ref 22–32)
Calcium: 8.9 mg/dL (ref 8.9–10.3)
Chloride: 103 mmol/L (ref 98–111)
Creatinine, Ser: 0.86 mg/dL (ref 0.61–1.24)
GFR calc Af Amer: >60 mL/min (ref >60)
GFR calc non Af Amer: >60 mL/min (ref >60)
Glucose, Bld: 148 mg/dL — ABNORMAL HIGH (ref 70–99)
Potassium: 4.7 mmol/L (ref 3.5–5.1)
Sodium: 140 mmol/L (ref 135–145)
Total Bilirubin: 0.5 mg/dL (ref 0.3–1.2)
Total Protein: 7.7 g/dL (ref 6.5–8.1)

## 2019-06-19 LAB — CBC WITH DIFFERENTIAL/PLATELET
Abs Immature Granulocytes: 0.07 10*3/uL (ref 0.00–0.07)
Basophils Absolute: 0 10*3/uL (ref 0.0–0.1)
Basophils Relative: 0 %
Eosinophils Absolute: 0 10*3/uL (ref 0.0–0.5)
Eosinophils Relative: 0 %
HCT: 40 % (ref 39.0–52.0)
Hemoglobin: 12.2 g/dL — ABNORMAL LOW (ref 13.0–17.0)
Immature Granulocytes: 1 %
Lymphocytes Relative: 8 %
Lymphs Abs: 0.9 10*3/uL (ref 0.7–4.0)
MCH: 22.5 pg — ABNORMAL LOW (ref 26.0–34.0)
MCHC: 30.5 g/dL (ref 30.0–36.0)
MCV: 73.7 fL — ABNORMAL LOW (ref 80.0–100.0)
Monocytes Absolute: 0.4 10*3/uL (ref 0.1–1.0)
Monocytes Relative: 4 %
Neutro Abs: 9.5 10*3/uL — ABNORMAL HIGH (ref 1.7–7.7)
Neutrophils Relative %: 87 %
Platelets: 252 10*3/uL (ref 150–400)
RBC: 5.43 MIL/uL (ref 4.22–5.81)
RDW: 15.6 % — ABNORMAL HIGH (ref 11.5–15.5)
WBC: 10.8 10*3/uL — ABNORMAL HIGH (ref 4.0–10.5)
nRBC: 0 % (ref 0.0–0.2)

## 2019-06-19 LAB — HEPATITIS B SURFACE ANTIBODY, QUANTITATIVE: Hep B S AB Quant (Post): 3.5 m[IU]/mL — ABNORMAL LOW (ref >9.9)

## 2019-06-19 LAB — D-DIMER, QUANTITATIVE: D-Dimer, Quant: 1.05 ug/mL-FEU — ABNORMAL HIGH (ref 0.00–0.50)

## 2019-06-19 LAB — C-REACTIVE PROTEIN: CRP: 13.6 mg/dL — ABNORMAL HIGH (ref <1.0)

## 2019-06-19 LAB — FERRITIN: Ferritin: 843 ng/mL — ABNORMAL HIGH (ref 24–336)

## 2019-06-19 LAB — GLUCOSE, CAPILLARY
Glucose-Capillary: 207 mg/dL — ABNORMAL HIGH (ref 70–99)
Glucose-Capillary: 206 mg/dL — ABNORMAL HIGH (ref 70–99)

## 2019-06-19 MED ORDER — INSULIN ASPART 100 UNIT/ML ~~LOC~~ SOLN
0.0000 [IU] | Freq: Every day | SUBCUTANEOUS | Status: DC
  Administered 2019-06-19: 2 [IU] via SUBCUTANEOUS

## 2019-06-19 MED ORDER — INSULIN ASPART 100 UNIT/ML ~~LOC~~ SOLN
0.0000 [IU] | Freq: Three times a day (TID) | SUBCUTANEOUS | Status: DC
  Administered 2019-06-19: 3 [IU] via SUBCUTANEOUS
  Administered 2019-06-20: 2 [IU] via SUBCUTANEOUS
  Administered 2019-06-20: 1 [IU] via SUBCUTANEOUS
  Administered 2019-06-20: 3 [IU] via SUBCUTANEOUS
  Administered 2019-06-21: 1 [IU] via SUBCUTANEOUS

## 2019-06-19 MED ORDER — POLYVINYL ALCOHOL 1.4 % OP SOLN
2.0000 [drp] | OPHTHALMIC | Status: DC | PRN
  Administered 2019-06-19: 2 [drp] via OPHTHALMIC
  Filled 2019-06-19: qty 15

## 2019-06-19 NOTE — Progress Notes (Signed)
PROGRESS NOTE  Tim Hunt  RFF:[id number] DOB: [birth year] DOA: 06/17/2019 PCP: Patient, No Pcp Per  Brief Narrative: Tim Hunt is a 53 y.o. male with a history of HTN, HLD, OSA not on CPAP who developed symptoms 11/30 including cough and fever, tested positive for covid-19 on 12/3, and presented to the ED 12/8 for progressive shortness of breath. In the ED he was febrile, tachycardic, tachypneic, and hypoxic requiring oxygen by nasal cannula. CRP elevated 12.9, PCT normal, d-dimer 1.22, and CXR demonstrated bilateral opacities consistent with covid-19 pneumonia. He was admitted to [facility], started on remdesivir, decadron, and a dose of tocilizumab administered.   Assessment & Plan: Active Problems:   COVID-19 virus infection  Acute hypoxic respiratory failure due to covid-19 pneumonia: Worsening. - Continue airborne, contact precautions. PPE including surgical gown, gloves, cap, shoe covers, and CAPR used during this encounter in a negative pressure room.  - Check daily labs: CBC w/diff, CMP, d-dimer, ferritin, CRP - Maintain euvolemia/net negative.  - Avoid NSAIDs - Recommend proning and aggressive use of incentive spirometry. - Continue remdesivir (12/8 - 12/12) Will need close monitoring of LFTs (ALT 168 > 200 > 191). - s/p tocilizumab 12/8.  - CRP improved today, still up from admission. Continue augmented steroid dosing, hopeful to deescalate soon.   ALT elevation: Suspect related to covid and NOT remdesivir as this has improved today.  - Monitor daily. Continue remdesivir. - Avoid hepatotoxins. Note bilirubin and AP wnl.  Neutrophilic leukocytosis: Difficult to interpret in setting of steroids.  - Continue monitoring. Culture if fevers.  Conjunctivitis:  - gtt's, showing signs of improvement.  Epistaxis: Resolved.  - Continue nasal saline, continue to monitor hgb, 12.2g/dl currently.   Acute blood loss anemia: Very mild. With microcytic indices though ferritin elevated  during acute illness.  - Recheck iron studies after discharge. Continue monitoring CBC.  HTN: - Holding norvasc while remains normotensive.  OSA: Not on CPAP at home.  - qHS O2  HLD:  - Stopped statin with elevated LFTs.  DVT prophylaxis: Lovenox Code Status: Full Family Communication: None at bedside. Will call fiance today. Disposition Plan: Home once improving and completed remdesivir  Consultants:   None  Procedures:   None  Antimicrobials:  Remdesivir 12/8 - 12/12  Subjective: Spanish video interpretor used throughout encounter. Reports SOB is a little better, still with nonproductive cough. Does not report chest pain.   Objective: Vitals:   06/18/19 2100 06/18/19 2300 06/19/19 0545 06/19/19 0938  BP: 114/66 116/75 116/72 (!) 125/53  Pulse: 80 83    Resp: (!) 35 (!) 32    Temp:  98.2 F (36.8 C) 98.3 F (36.8 C) 98.5 F (36.9 C)  TempSrc:  Oral Oral Oral  SpO2: 94% 94%    Weight:      Height:        Intake/Output Summary (Last 24 hours) at 06/19/2019 1119 Last data filed at 06/19/2019 0815 Gross per 24 hour  Intake -  Output 1050 ml  Net -1050 ml   Filed Weights   06/17/19 1238  Weight: 79.4 kg   Gen: 53 y.o. male in no distress Pulm: Nonlabored breathing 2L O2, diminished. CV: Regular rate and rhythm. No murmur, rub, or gallop. No JVD, no dependent edema. GI: Abdomen soft, non-tender, non-distended, with normoactive bowel sounds.  Ext: Warm, no deformities Skin: No rashes, lesions or ulcers on visualized skin. Neuro: Alert and oriented. No focal neurological deficits. Psych: Judgement and insight appear fair. Mood euthymic & affect congruent.  Behavior is appropriate.    Data Reviewed: I have personally reviewed following labs and imaging studies  CBC: Recent Labs  Lab 06/17/19 1350 06/18/19 0500 06/19/19 0220  WBC 6.8 7.5 10.8*  NEUTROABS 5.6 6.5 9.5*  HGB 13.1 12.6* 12.2*  HCT 43.0 40.8 40.0  MCV 74.3* 73.6* 73.7*  PLT 197 216  252   Basic Metabolic Panel: Recent Labs  Lab 06/17/19 1350 06/18/19 0500 06/19/19 0220  NA 138 137 140  K 4.2 4.1 4.7  CL 99 99 103  CO2 26 24 25   GLUCOSE 112* 172* 148*  BUN 14 16 27*  CREATININE 1.20 0.97 0.86  CALCIUM 8.7* 8.7* 8.9  MG  --  2.3  --   PHOS  --  3.5  --    GFR: Estimated Creatinine Clearance: 111.6 mL/min (by C-G formula based on SCr of 0.86 mg/dL). Liver Function Tests: Recent Labs  Lab 06/17/19 1350 06/18/19 0500 06/19/19 0220  AST 106* 132* 111*  ALT 168* 200* 191*  ALKPHOS 49 54 53  BILITOT 0.5 0.4 0.5  PROT 8.0 8.0 7.7  ALBUMIN 3.7 3.6 3.5   No results for input(s): LIPASE, AMYLASE in the last 168 hours. No results for input(s): AMMONIA in the last 168 hours. Coagulation Profile: No results for input(s): INR, PROTIME in the last 168 hours. Cardiac Enzymes: No results for input(s): CKTOTAL, CKMB, CKMBINDEX, TROPONINI in the last 168 hours. BNP (last 3 results) No results for input(s): PROBNP in the last 8760 hours. HbA1C: No results for input(s): HGBA1C in the last 72 hours. CBG: No results for input(s): GLUCAP in the last 168 hours. Lipid Profile: Recent Labs    06/17/19 1350  TRIG 81   Thyroid Function Tests: No results for input(s): TSH, T4TOTAL, FREET4, T3FREE, THYROIDAB in the last 72 hours. Anemia Panel: Recent Labs    06/18/19 0500 06/19/19 0220  FERRITIN 803* 843*   Urine analysis: No results found for: COLORURINE, APPEARANCEUR, LABSPEC, PHURINE, GLUCOSEU, HGBUR, BILIRUBINUR, KETONESUR, PROTEINUR, UROBILINOGEN, NITRITE, LEUKOCYTESUR Recent Results (from the past 240 hour(s))  Blood Culture (routine x 2)     Status: None (Preliminary result)   Collection Time: 06/17/19  1:07 PM   Specimen: BLOOD  Result Value Ref Range Status   Specimen Description   Final    BLOOD RIGHT ANTECUBITAL Performed at [facility], [address] [place], [place] [address]    Special Requests   Final    BOTTLES DRAWN  AEROBIC AND ANAEROBIC Blood Culture adequate volume Performed at [facility], [address] [place], [place] [address]    Culture   Final    NO GROWTH 2 DAYS Performed at [facility], [address] [place], [place] [address]    Report Status PENDING  Incomplete  Blood Culture (routine x 2)     Status: None (Preliminary result)   Collection Time: 06/17/19  1:50 PM   Specimen: BLOOD  Result Value Ref Range Status   Specimen Description   Final    BLOOD LEFT ANTECUBITAL Performed at [facility], [address] [place], [place] [address]    Special Requests   Final    BOTTLES DRAWN AEROBIC AND ANAEROBIC Blood Culture adequate volume Performed at [facility], [address] [place], [place] [address]    Culture   Final    NO GROWTH 2 DAYS Performed at [facility], [address] [place], [place] [address]  Report Status PENDING  Incomplete      Radiology Studies: DG Chest Port 1 View  Result Date: 06/17/2019 CLINICAL DATA:  Shortness of breath, COVID-19 positive. EXAM: PORTABLE CHEST 1 VIEW COMPARISON:  None. FINDINGS: The heart size and mediastinal contours are within normal limits. Multiple ill-defined airspace opacities are noted in both lungs, right greater than left. The visualized skeletal structures are unremarkable. IMPRESSION: Multiple lung opacities are noted bilaterally concerning for multifocal pneumonia, potentially of viral etiology. Electronically Signed   By: Lupita Raider M.D.   On: 06/17/2019 15:23    Scheduled Meds: . albuterol  2 puff Inhalation Q6H  . dexamethasone (DECADRON) injection  6 mg Intravenous Q12H  . enoxaparin (LOVENOX) injection  40 mg Subcutaneous Q24H  . olopatadine  1 drop Both Eyes BID   Continuous Infusions: . remdesivir 100 mg in NS 100 mL 100 mg (06/19/19 0933)     LOS: 2 days   Time spent: 35 minutes.  Tyrone Nine, MD [facility]  [email or web address] 06/19/2019, 11:19 AM

## 2019-06-19 NOTE — Progress Notes (Signed)
A/Ox4, pt remains on 2L N/C. No noted distress or pain. [employer] (interpreter) is used when providing care and communicating with patients. Pt was thankful for the care that he is receiving. He continues to use the incentive.   Misty Cornell is the pt's friend she called couple of times at the beginning of shift which was difficult to answer her calls resulting in caring pt and other pts. Misty Cornell noted that out angry that she is employed at [employer] and no one had call her all day. Writer educated her on the night of the patient's admission and tonight, the staff potential goal is to call a family member, but the acuity of patients we are sometimes unable to make those calls. Misty Cornell went on to degrade the care that [employer] provide to pt. Writer explain she would be willing to forward her call to CN. Writer informed Ms. Cornell she understands her concerns, but she had to attend to the positions result in that was out of her scope, but is willing to inform the CN. She went on to say we do not understand the patient d/t his language. Again the writer explain there in no language barrier the interpreter [facility]) is at the bedside and has been since admission.

## 2019-06-19 NOTE — Plan of Care (Signed)
  Problem: Coping: Goal: Psychosocial and spiritual needs will be supported Outcome: Progressing Note: Pt has remained in good spirits during my care. Pt maintains a relaxed demeanor and is sociable and engaged in his care.    Problem: Respiratory: Goal: Will maintain a patent airway Outcome: Progressing Note: Pt has been able to maintain his airway and has not shown sign of respiratory distress during my care.

## 2019-06-19 NOTE — Progress Notes (Signed)
Called Misty Cornell to educate her on the patient's status. As Probation officer promised at the end of shift will call when  the computers are up.

## 2019-06-20 DIAGNOSIS — I10 Essential (primary) hypertension: Secondary | ICD-10-CM | POA: Diagnosis present

## 2019-06-20 DIAGNOSIS — E119 Type 2 diabetes mellitus without complications: Secondary | ICD-10-CM

## 2019-06-20 DIAGNOSIS — E7800 Pure hypercholesterolemia, unspecified: Secondary | ICD-10-CM | POA: Diagnosis present

## 2019-06-20 DIAGNOSIS — J9601 Acute respiratory failure with hypoxia: Secondary | ICD-10-CM | POA: Diagnosis present

## 2019-06-20 DIAGNOSIS — R040 Epistaxis: Secondary | ICD-10-CM | POA: Diagnosis not present

## 2019-06-20 DIAGNOSIS — D62 Acute posthemorrhagic anemia: Secondary | ICD-10-CM | POA: Diagnosis not present

## 2019-06-20 DIAGNOSIS — H109 Unspecified conjunctivitis: Secondary | ICD-10-CM | POA: Diagnosis present

## 2019-06-20 DIAGNOSIS — R7989 Other specified abnormal findings of blood chemistry: Secondary | ICD-10-CM | POA: Diagnosis present

## 2019-06-20 LAB — COMPREHENSIVE METABOLIC PANEL
ALT: 214 U/L — ABNORMAL HIGH (ref 0–44)
AST: 109 U/L — ABNORMAL HIGH (ref 15–41)
Albumin: 3.2 g/dL — ABNORMAL LOW (ref 3.5–5.0)
Alkaline Phosphatase: 54 U/L (ref 38–126)
Anion gap: 10 (ref 5–15)
BUN: 27 mg/dL — ABNORMAL HIGH (ref 6–20)
CO2: 26 mmol/L (ref 22–32)
Calcium: 8.8 mg/dL — ABNORMAL LOW (ref 8.9–10.3)
Chloride: 104 mmol/L (ref 98–111)
Creatinine, Ser: 0.86 mg/dL (ref 0.61–1.24)
GFR calc Af Amer: >60 mL/min (ref >60)
GFR calc non Af Amer: >60 mL/min (ref >60)
Glucose, Bld: 175 mg/dL — ABNORMAL HIGH (ref 70–99)
Potassium: 4.3 mmol/L (ref 3.5–5.1)
Sodium: 140 mmol/L (ref 135–145)
Total Bilirubin: 0.5 mg/dL (ref 0.3–1.2)
Total Protein: 7.3 g/dL (ref 6.5–8.1)

## 2019-06-20 LAB — HEMOGLOBIN A1C
Hgb A1c MFr Bld: 7.2 % — ABNORMAL HIGH (ref 4.8–5.6)
Mean Plasma Glucose: 159.94 mg/dL

## 2019-06-20 LAB — CBC WITH DIFFERENTIAL/PLATELET
Abs Immature Granulocytes: 0.4 10*3/uL — ABNORMAL HIGH (ref 0.00–0.07)
Basophils Absolute: 0 10*3/uL (ref 0.0–0.1)
Basophils Relative: 0 %
Eosinophils Absolute: 0 10*3/uL (ref 0.0–0.5)
Eosinophils Relative: 0 %
HCT: 38.4 % — ABNORMAL LOW (ref 39.0–52.0)
Hemoglobin: 11.7 g/dL — ABNORMAL LOW (ref 13.0–17.0)
Immature Granulocytes: 3 %
Lymphocytes Relative: 7 %
Lymphs Abs: 0.9 10*3/uL (ref 0.7–4.0)
MCH: 22.4 pg — ABNORMAL LOW (ref 26.0–34.0)
MCHC: 30.5 g/dL (ref 30.0–36.0)
MCV: 73.6 fL — ABNORMAL LOW (ref 80.0–100.0)
Monocytes Absolute: 0.7 10*3/uL (ref 0.1–1.0)
Monocytes Relative: 6 %
Neutro Abs: 10.8 10*3/uL — ABNORMAL HIGH (ref 1.7–7.7)
Neutrophils Relative %: 84 %
Platelets: 319 10*3/uL (ref 150–400)
RBC: 5.22 MIL/uL (ref 4.22–5.81)
RDW: 15.5 % (ref 11.5–15.5)
WBC: 12.8 10*3/uL — ABNORMAL HIGH (ref 4.0–10.5)
nRBC: 0.2 % (ref 0.0–0.2)

## 2019-06-20 LAB — GLUCOSE, CAPILLARY
Glucose-Capillary: 170 mg/dL — ABNORMAL HIGH (ref 70–99)
Glucose-Capillary: 207 mg/dL — ABNORMAL HIGH (ref 70–99)
Glucose-Capillary: 143 mg/dL — ABNORMAL HIGH (ref 70–99)
Glucose-Capillary: 149 mg/dL — ABNORMAL HIGH (ref 70–99)

## 2019-06-20 LAB — FERRITIN: Ferritin: 900 ng/mL — ABNORMAL HIGH (ref 24–336)

## 2019-06-20 LAB — C-REACTIVE PROTEIN: CRP: 7.7 mg/dL — ABNORMAL HIGH (ref <1.0)

## 2019-06-20 LAB — D-DIMER, QUANTITATIVE: D-Dimer, Quant: 0.95 ug/mL-FEU — ABNORMAL HIGH (ref 0.00–0.50)

## 2019-06-20 MED ORDER — DEXAMETHASONE SODIUM PHOSPHATE 10 MG/ML IJ SOLN
6.0000 mg | INTRAMUSCULAR | Status: DC
  Administered 2019-06-20: 6 mg via INTRAVENOUS
  Filled 2019-06-20: qty 1

## 2019-06-20 MED ORDER — LIVING WELL WITH DIABETES BOOK
Freq: Once | Status: AC
  Administered 2019-06-20: 19:00:00
  Filled 2019-06-20: qty 1

## 2019-06-20 NOTE — Progress Notes (Signed)
PROGRESS NOTE  Tim CaffeyMariano Swingle  ZOX:[id number] DOB: 09/23/1965 DOA: 06/17/2019 PCP: Patient, No Pcp Per  Brief Narrative: Tim Hunt is a 53 y.o. male with a history of HTN, HLD, OSA not on CPAP who developed symptoms 11/30 including cough and fever, tested positive for covid-19 on 12/3, and presented to the ED 12/8 for progressive shortness of breath. In the ED he was febrile, tachycardic, tachypneic, and hypoxic requiring oxygen by nasal cannula. CRP elevated 12.9, PCT normal, d-dimer 1.22, and CXR demonstrated bilateral opacities consistent with covid-19 pneumonia. [facility], started on remdesivir, decadron, and a dose of tocilizumab administered.   Assessment & Plan: Active Problems:   COVID-19 virus infection  Acute hypoxic respiratory failure due to covid-19 pneumonia: Worsening. - Continue airborne, contact precautions. PPE including surgical gown, gloves, cap, shoe covers, and CAPR used during this encounter in a negative pressure room.  - Check daily labs: CBC w/diff, CMP, d-dimer, ferritin, CRP - Maintain euvolemia/net negative.  - Avoid NSAIDs - Recommend proning and aggressive use of incentive spirometry. - Continue remdesivir (12/8 - 12/12) Will need close monitoring of LFTs (ALT 168 > 200 > 191 > 214). - s/p tocilizumab 12/8.  - CRP continues to improve. Will deescalate steroids with fasting hyperglycemia.   ALT elevation: Due to covid as it was POA. - Monitor daily. Remains < 220 and we can continue remdesivir. - Avoid hepatotoxins. Note bilirubin and AP wnl. - Note hepatitis B Ag neg, Ab is weakly positive. Would suggest booster vaccination at follow up  Fasting hyperglycemia, T2DM: HbA1c 7.2%. Fiance reports history of diabetes but was taken off medications.  - Continue sensitive SSI, carb-modified diet.  - Diabetes coordinator consult for education.  - Would benefit from metformin.   Neutrophilic leukocytosis: Difficult to interpret in setting of  steroids.  - Continue monitoring. Culture if fevers.  Conjunctivitis:  - gtt's, showing signs of improvement.  Epistaxis: Resolved.  - Continue nasal saline, continue to monitor hgb, 11.7g/dl currently.   Acute blood loss anemia: Very mild, worsening. With microcytic indices though ferritin elevated during acute illness.  - Recheck iron studies after discharge. Continue monitoring CBC, Tx/Ppx epistaxis as above.  HTN: - Holding norvasc while remains normotensive.  OSA: Not on CPAP at home.  - qHS O2  HLD:  - Stopped statin with elevated LFTs.  DVT prophylaxis: Lovenox Code Status: Full Family Communication: None at bedside. Speaking with fiance daily. Disposition Plan: Home once improving and completed remdesivir. Earliest would be 12/12, will check ambulatory pulse oximetry  Consultants:   None  Procedures:   None  Antimicrobials:  Remdesivir 12/8 - 12/12  Subjective: Spanish video interpretor, Lizette [phone], used and RN present throughout encounter. Feels much better, eager to go home. Denies infection or vaccination with hepatitis B. No abd pain. Still has chest soreness which is intermittent, worse with deep breaths or cough, overall unchanged, not wosre with exertion.   Objective: Vitals:   06/19/19 1642 06/19/19 2100 06/20/19 0029 06/20/19 0500  BP: 132/71 119/75 117/72 123/70  Pulse: 82 79 71 74  Resp: (!) 30 (!) 33 19 (!) 29  Temp: 97.9 F (36.6 C) 98.6 F (37 C) 98.3 F (36.8 C) 99.1 F (37.3 C)  TempSrc: Oral Oral Oral Oral  SpO2: 92% 94% 96% 94%  Weight:      Height:        Intake/Output Summary (Last 24 hours) at 06/20/2019 0730 Last data filed at 06/20/2019 0257 Gross per 24 hour  Intake 340 ml  Output 1000 ml  Net -660 ml   Filed Weights   06/17/19 1238  Weight: 79.4 kg   Gen: 53 y.o. male in no distress Pulm: Nonlabored breathing 2L O2, diminished. CV: Regular rate and rhythm. No murmur, rub, or gallop. No JVD, no dependent  edema. GI: Abdomen soft, non-tender, non-distended, with normoactive bowel sounds.  Ext: Warm, no deformities Skin: No rashes, lesions or ulcers on visualized skin. Neuro: Alert and oriented. No focal neurological deficits. Psych: Judgement and insight appear fair. Mood euthymic & affect congruent. Behavior is appropriate.    Data Reviewed: I have personally reviewed following labs and imaging studies  CBC: Recent Labs  Lab 06/17/19 1350 06/18/19 0500 06/19/19 0220 06/20/19 0045  WBC 6.8 7.5 10.8* 12.8*  NEUTROABS 5.6 6.5 9.5* 10.8*  HGB 13.1 12.6* 12.2* 11.7*  HCT 43.0 40.8 40.0 38.4*  MCV 74.3* 73.6* 73.7* 73.6*  PLT 197 216 252 253   Basic Metabolic Panel: Recent Labs  Lab 06/17/19 1350 06/18/19 0500 06/19/19 0220 06/20/19 0045  NA 138 137 140 140  K 4.2 4.1 4.7 4.3  CL 99 99 103 104  CO2 26 24 25 26   GLUCOSE 112* 172* 148* 175*  BUN 14 16 27* 27*  CREATININE 1.20 0.97 0.86 0.86  CALCIUM 8.7* 8.7* 8.9 8.8*  MG  --  2.3  --   --   PHOS  --  3.5  --   --    GFR: Estimated Creatinine Clearance: 111.6 mL/min (by C-G formula based on SCr of 0.86 mg/dL). Liver Function Tests: Recent Labs  Lab 06/17/19 1350 06/18/19 0500 06/19/19 0220 06/20/19 0045  AST 106* 132* 111* 109*  ALT 168* 200* 191* 214*  ALKPHOS 49 54 53 54  BILITOT 0.5 0.4 0.5 0.5  PROT 8.0 8.0 7.7 7.3  ALBUMIN 3.7 3.6 3.5 3.2*   No results for input(s): LIPASE, AMYLASE in the last 168 hours. No results for input(s): AMMONIA in the last 168 hours. Coagulation Profile: No results for input(s): INR, PROTIME in the last 168 hours. Cardiac Enzymes: No results for input(s): CKTOTAL, CKMB, CKMBINDEX, TROPONINI in the last 168 hours. BNP (last 3 results) No results for input(s): PROBNP in the last 8760 hours. HbA1C: No results for input(s): HGBA1C in the last 72 hours. CBG: Recent Labs  Lab 06/19/19 1637 06/19/19 2102  GLUCAP 207* 206*   Lipid Profile: Recent Labs    06/17/19 1350  TRIG  81   Thyroid Function Tests: No results for input(s): TSH, T4TOTAL, FREET4, T3FREE, THYROIDAB in the last 72 hours. Anemia Panel: Recent Labs    06/19/19 0220 06/20/19 0045  FERRITIN 843* 900*   Urine analysis: No results found for: COLORURINE, APPEARANCEUR, LABSPEC, PHURINE, GLUCOSEU, HGBUR, BILIRUBINUR, KETONESUR, PROTEINUR, UROBILINOGEN, NITRITE, LEUKOCYTESUR Recent Results (from the past 240 hour(s))  Blood Culture (routine x 2)     Status: None (Preliminary result)   Collection Time: 06/17/19  1:07 PM   Specimen: BLOOD  Result Value Ref Range Status   Specimen Description   Final    BLOOD RIGHT ANTECUBITAL Performed at [facility] [address], [place], [place] [address]    Special Requests   Final    BOTTLES DRAWN AEROBIC AND ANAEROBIC Blood Culture adequate volume Performed at [facility] [address] [place], [place] [address]    Culture   Final    NO GROWTH 2 DAYS Performed at [facility] [address],  [place], [place] [address]    Report Status PENDING  Incomplete  Blood Culture (routine x 2)     Status: None (Preliminary result)   Collection Time: 06/17/19  1:50 PM   Specimen: BLOOD  Result Value Ref Range Status   Specimen Description   Final    BLOOD LEFT ANTECUBITAL Performed at [facility], [address], [place], [place] [address]    Special Requests   Final    BOTTLES DRAWN AEROBIC AND ANAEROBIC Blood Culture adequate volume Performed at [facility], [address] [place], [place] [address]    Culture   Final    NO GROWTH 2 DAYS Performed at [facility], [address], [place], [place] [address]    Report Status PENDING  Incomplete      Radiology Studies: No results found.  Scheduled Meds: . albuterol  2 puff Inhalation Q6H  . dexamethasone (DECADRON) injection  6 mg Intravenous Q12H  . enoxaparin (LOVENOX) injection  40 mg Subcutaneous  Q24H  . insulin aspart  0-5 Units Subcutaneous QHS  . insulin aspart  0-9 Units Subcutaneous TID WC  . olopatadine  1 drop Both Eyes BID   Continuous Infusions: . remdesivir 100 mg in NS 100 mL 100 mg (06/19/19 0933)     LOS: 3 days   Time spent: 35 minutes.  Tyrone Nine, MD [facility] [email or web address] 06/20/2019, 7:30 AM

## 2019-06-20 NOTE — Plan of Care (Signed)
  Problem: Education: Goal: Knowledge of risk factors and measures for prevention of condition will improve Outcome: Progressing   Problem: Coping: Goal: Psychosocial and spiritual needs will be supported Outcome: Progressing   Problem: Respiratory: Goal: Will maintain a patent airway Outcome: Progressing Goal: Complications related to the disease process, condition or treatment will be avoided or minimized Outcome: Progressing   

## 2019-06-20 NOTE — Progress Notes (Signed)
Received diabetes coordinator consult. Attempted to call patient X3 on room phone, but did not answer. Spoke with staff RN X2 to make sure the phone was near the patient. Staff RN states that patient can communicate in [name].   Will order Living Well with Diabetes booklet in Spanish. Will attach diabetes Exit Notes for discharge. HgbA1C of 7.2%. Would probably benefit from an oral agent at home. Will need to follow up with PCP after discharge. Will need prescription for home blood glucose meter, strips, and lancets at discharge (order # [id number])  Will continue to monitor blood sugars while in the hospital.  Will try to follow up with phone call before discharge.   Harvel Ricks RN BSN CDE Diabetes Coordinator Pager: [phone]  8am-5pm

## 2019-06-20 NOTE — Progress Notes (Signed)
   06/20/19 1602  Family/Significant Other Communication  Family/Significant Other Update Called;Other (Comment) (no answer Misty POA [phone])

## 2019-06-20 NOTE — Progress Notes (Signed)
SATURATION QUALIFICATIONS: (This note is used to comply with regulatory documentation for home oxygen)  Patient Saturations on Room Air at Rest = 91  Patient Saturations on Room Air while Ambulating = 85  Patient Saturations on 2 Liters of oxygen while Ambulating = 93  Please briefly explain why patient needs home oxygen:  Patient oxygen desat decreased with ambulation to 85% amd had to be put on 2L [place] to regain a 93%/. Patient felt dizzy when ambulating on room at at 85%.

## 2019-06-21 DIAGNOSIS — H109 Unspecified conjunctivitis: Secondary | ICD-10-CM

## 2019-06-21 DIAGNOSIS — E119 Type 2 diabetes mellitus without complications: Secondary | ICD-10-CM

## 2019-06-21 DIAGNOSIS — D62 Acute posthemorrhagic anemia: Secondary | ICD-10-CM

## 2019-06-21 DIAGNOSIS — E7800 Pure hypercholesterolemia, unspecified: Secondary | ICD-10-CM

## 2019-06-21 DIAGNOSIS — G4733 Obstructive sleep apnea (adult) (pediatric): Secondary | ICD-10-CM

## 2019-06-21 DIAGNOSIS — R7989 Other specified abnormal findings of blood chemistry: Secondary | ICD-10-CM

## 2019-06-21 DIAGNOSIS — I10 Essential (primary) hypertension: Secondary | ICD-10-CM

## 2019-06-21 DIAGNOSIS — J1289 Other viral pneumonia: Secondary | ICD-10-CM

## 2019-06-21 DIAGNOSIS — J9601 Acute respiratory failure with hypoxia: Secondary | ICD-10-CM

## 2019-06-21 DIAGNOSIS — R040 Epistaxis: Secondary | ICD-10-CM

## 2019-06-21 LAB — COMPREHENSIVE METABOLIC PANEL
ALT: 269 U/L — ABNORMAL HIGH (ref 0–44)
AST: 119 U/L — ABNORMAL HIGH (ref 15–41)
Albumin: 3.3 g/dL — ABNORMAL LOW (ref 3.5–5.0)
Alkaline Phosphatase: 58 U/L (ref 38–126)
Anion gap: 11 (ref 5–15)
BUN: 24 mg/dL — ABNORMAL HIGH (ref 6–20)
CO2: 28 mmol/L (ref 22–32)
Calcium: 9 mg/dL (ref 8.9–10.3)
Chloride: 102 mmol/L (ref 98–111)
Creatinine, Ser: 0.83 mg/dL (ref 0.61–1.24)
GFR calc Af Amer: >60 mL/min (ref >60)
GFR calc non Af Amer: >60 mL/min (ref >60)
Glucose, Bld: 184 mg/dL — ABNORMAL HIGH (ref 70–99)
Potassium: 4.7 mmol/L (ref 3.5–5.1)
Sodium: 141 mmol/L (ref 135–145)
Total Bilirubin: 0.4 mg/dL (ref 0.3–1.2)
Total Protein: 7.5 g/dL (ref 6.5–8.1)

## 2019-06-21 LAB — CBC WITH DIFFERENTIAL/PLATELET
Abs Immature Granulocytes: 0.52 10*3/uL — ABNORMAL HIGH (ref 0.00–0.07)
Basophils Absolute: 0.1 10*3/uL (ref 0.0–0.1)
Basophils Relative: 1 %
Eosinophils Absolute: 0 10*3/uL (ref 0.0–0.5)
Eosinophils Relative: 0 %
HCT: 40.5 % (ref 39.0–52.0)
Hemoglobin: 12.4 g/dL — ABNORMAL LOW (ref 13.0–17.0)
Immature Granulocytes: 5 %
Lymphocytes Relative: 11 %
Lymphs Abs: 1.2 10*3/uL (ref 0.7–4.0)
MCH: 22.6 pg — ABNORMAL LOW (ref 26.0–34.0)
MCHC: 30.6 g/dL (ref 30.0–36.0)
MCV: 73.9 fL — ABNORMAL LOW (ref 80.0–100.0)
Monocytes Absolute: 0.5 10*3/uL (ref 0.1–1.0)
Monocytes Relative: 5 %
Neutro Abs: 8.1 10*3/uL — ABNORMAL HIGH (ref 1.7–7.7)
Neutrophils Relative %: 78 %
Platelets: 372 10*3/uL (ref 150–400)
RBC: 5.48 MIL/uL (ref 4.22–5.81)
RDW: 15.5 % (ref 11.5–15.5)
WBC: 10.5 10*3/uL (ref 4.0–10.5)
nRBC: 0 % (ref 0.0–0.2)

## 2019-06-21 LAB — GLUCOSE, CAPILLARY: Glucose-Capillary: 141 mg/dL — ABNORMAL HIGH (ref 70–99)

## 2019-06-21 LAB — FERRITIN: Ferritin: 588 ng/mL — ABNORMAL HIGH (ref 24–336)

## 2019-06-21 LAB — C-REACTIVE PROTEIN: CRP: 3.9 mg/dL — ABNORMAL HIGH (ref <1.0)

## 2019-06-21 LAB — D-DIMER, QUANTITATIVE: D-Dimer, Quant: 1.14 ug/mL-FEU — ABNORMAL HIGH (ref 0.00–0.50)

## 2019-06-21 MED ORDER — METFORMIN HCL 500 MG PO TABS
500.0000 mg | ORAL_TABLET | Freq: Every day | ORAL | Status: DC
  Administered 2019-06-21: 500 mg via ORAL
  Filled 2019-06-21: qty 1

## 2019-06-21 MED ORDER — BLOOD GLUCOSE METER KIT: PACK | 0 refills | Status: AC

## 2019-06-21 MED ORDER — METFORMIN HCL 500 MG PO TABS: 500.0000 mg | ORAL_TABLET | Freq: Two times a day (BID) | ORAL | 2 refills | Status: AC

## 2019-06-21 MED ORDER — DEXAMETHASONE 6 MG PO TABS: 6.0000 mg | ORAL_TABLET | Freq: Every day | ORAL | 0 refills | Status: AC

## 2019-06-21 NOTE — Progress Notes (Signed)
Pt discharge instructions given, all questions answered. Medications reviewed with patient, patient verbalizes understanding. Reviewed Diabetes book with patient and the importance of checking blood sugars. Pt verbalized understanding. Reviewed COVID 19 quarantine instructions. Pt has no further questions at this time. Pt condition unchanged and stable for discharge. PIV removed, cath intact. Pt tolerated well. Pt ambulated to private vehicle.

## 2019-06-21 NOTE — Plan of Care (Signed)
Pt slept well during the night. Tim Hunt to communicate well in [name] without need for interpreter. No complaints of pain verbalized. Alert and oriented. Vitals stable on 2L [name]. Minimal dyspnea on exertion and productive cough noted, also noted to have mild epistaxis, prn saline nasal spray given, O2 humidified. Independent with ADLs. IV SL. No other issues, will monitor.   Problem: Education: Goal: Knowledge of risk factors and measures for prevention of condition will improve Outcome: Progressing   Problem: Coping: Goal: Psychosocial and spiritual needs will be supported Outcome: Progressing   Problem: Respiratory: Goal: Will maintain a patent airway Outcome: Progressing Goal: Complications related to the disease process, condition or treatment will be avoided or minimized Outcome: Progressing

## 2019-06-21 NOTE — Discharge Summary (Signed)
Physician Discharge Summary  Tim Hunt WER:[id number] DOB: [birth year] DOA: 06/17/2019  PCP: Patient, No Pcp Per  Admit date: 06/17/2019 Discharge date: 06/21/2019  Admitted From: Home Disposition: Home   Recommendations for Outpatient Follow-up:  1. Follow up with PCP in 1-2 weeks, continue management for diabetes. Started metformin and prescribed glucometer with supplies at discharge.  2. Please obtain CMP/CBC in one week. Final dose of remdesivir held due to ALT elevation.   Home Health: None Equipment/Devices: Glucometer prescribed, 2L O2 with exertion Discharge Condition: Stable CODE STATUS: Full Diet recommendation: Carb-modified  Brief/Interim Summary: Tim Hunt is a 53 y.o. male with a history of HTN, HLD, OSA not on CPAP who developed symptoms 11/30 including cough and fever, tested positive for covid-19 on 12/3, and presented to the ED 12/8 for progressive shortness of breath. In the ED he was febrile, tachycardic, tachypneic, and hypoxic requiring oxygen by nasal cannula. CRP elevated 12.9, PCT normal, d-dimer 1.22, and CXR demonstrated bilateral opacities consistent with covid-19 pneumonia. He was admitted to [facility], started on remdesivir, decadron, and a dose of tocilizumab administered. He was admitted to [facility], continued on remdesivir and steroids with improvement in hypoxia. ALT rose >220 so the 5th dose of remdesivir was held.    Discharge Diagnoses:  Principal Problem:   Pneumonia due to COVID-19 virus Active Problems:   Obstructive sleep apnea   Acute respiratory failure with hypoxia (HCC)   Hypercholesterolemia   Hypertension   Conjunctivitis   T2DM (type 2 diabetes mellitus) (HCC)   Epistaxis   Acute blood loss anemia   LFT elevation  Acute hypoxic respiratory failure due to covid-19 pneumonia: Worsening. - Continue isolation for 21 days from first positive test.  - Continue steroids x10 total days (prescribed at discharge) - Continue 2L O2 with  exertion. Hypoxia resolved at rest, anticipate continued improvement and complete resolution of hypoxia after discharge.  - Avoid NSAIDs - Recommend use of incentive spirometry. - Continued remdesivir x4 doses (12/8 - 12/11) Will need close monitoring of LFTs (ALT 168 > 200 > 191 > 214 > 269). - s/p tocilizumab 12/8.  - CRP continues to improve.    ALT elevation: Due to covid as it was POA, worsening limiting remdesivir use.  - Monitor at follow up - Avoid hepatotoxins. Note bilirubin and AP wnl. - Note hepatitis B Ag neg, Ab is weakly positive. Consider booster vaccination at follow up.  Fasting hyperglycemia, T2DM: HbA1c 7.2%. Fiance reports history of diabetes but was taken off medications. Will need PCP follow up. Rx glucose meter at discharge and restart metformin 598m po BID.  - Diabetes coordinator consult for education.   Neutrophilia: Most likely due to steroids.   Conjunctivitis:  - gtt's, showing signs of improvement.  Epistaxis: Resolved.  - Continue nasal saline, continue to monitor hgb, 11.7g/dl currently.   Acute blood loss anemia: Very mild. With microcytic indices though ferritin elevated during acute illness.  - Recheck iron studies after discharge. Continue monitoring CBC. Hgb 12.4g/dl at discharge.  HTN: - Restart home norvasc  OSA: Not on CPAP at home.  - Continue counseling Re: CPAP  HLD:  - Stopped statin with elevated LFTs. Please restart at follow up if LFTs improved.  Discharge Instructions Discharge Instructions    Diet Carb Modified   Complete by: As directed    Discharge instructions   Complete by: As directed    You are being discharged from the hospital after treatment for covid-19 infection. You are felt to be stable  enough to no longer require inpatient monitoring, testing, and treatment, though you will need to follow the recommendations below: - Continue taking decadron for 5 more days - Start taking metformin twice daily to treat  diabetes - Check your blood sugar twice daily and record those values, present them to your PCP at follow up - Stay in isolation for 21 days from your first positive test.  - You may need extra oxygen on exertion for a while, though this is not felt to be a long term need. Please follow up with your PCP to have your pulse oximetry rechecked. - Do not take NSAID medications (including, but not limited to, ibuprofen, advil, motrin, naproxen, aleve, goody's powder, etc.) - Follow up with your doctor in the next week via telehealth or seek medical attention right away if your symptoms get WORSE.  - Consider donating plasma after you have recovered (either 14 days after a negative test or 28 days after symptoms have completely resolved) because your antibodies to this virus may be helpful to give to others with life-threatening infections. Please go to the website www.[email or web address] if you would like to consider volunteering for plasma donation.    Directions for you at home:  Wear a facemask You should wear a facemask that covers your nose and mouth when you are in the same room with other people and when you visit a healthcare provider. People who live with or visit you should also wear a facemask while they are in the same room with you.  Separate yourself from other people in your home As much as possible, you should stay in a different room from other people in your home. Also, you should use a separate bathroom, if available.  Avoid sharing household items You should not share dishes, drinking glasses, cups, eating utensils, towels, bedding, or other items with other people in your home. After using these items, you should wash them thoroughly with soap and water.  Cover your coughs and sneezes Cover your mouth and nose with a tissue when you cough or sneeze, or you can cough or sneeze into your sleeve. Throw used tissues in a lined trash can, and immediately wash your hands with soap and  water for at least 20 seconds or use an alcohol-based hand rub.  Wash your [facility] your hands often and thoroughly with soap and water for at least 20 seconds. You can use an alcohol-based hand sanitizer if soap and water are not available and if your hands are not visibly dirty. Avoid touching your eyes, nose, and mouth with unwashed hands.  Directions for those who live with, or provide care at home for you:  Limit the number of people who have contact with the patient If possible, have only one caregiver for the patient. Other household members should stay in another home or place of residence. If this is not possible, they should stay in another room, or be separated from the patient as much as possible. Use a separate bathroom, if available. Restrict visitors who do not have an essential need to be in the home.  Ensure good ventilation Make sure that shared spaces in the home have good air flow, such as from an air conditioner or an opened window, weather permitting.  Wash your hands often Wash your hands often and thoroughly with soap and water for at least 20 seconds. You can use an alcohol based hand sanitizer if soap and water are not available and if your hands are  not visibly dirty. Avoid touching your eyes, nose, and mouth with unwashed hands. Use disposable paper towels to dry your hands. If not available, use dedicated cloth towels and replace them when they become wet.  Wear a facemask and gloves Wear a disposable facemask at all times in the room and gloves when you touch or have contact with the patient's blood, body fluids, and/or secretions or excretions, such as sweat, saliva, sputum, nasal mucus, vomit, urine, or feces.  Ensure the mask fits over your nose and mouth tightly, and do not touch it during use. Throw out disposable facemasks and gloves after using them. Do not reuse. Wash your hands immediately after removing your facemask and gloves. If your personal  clothing becomes contaminated, carefully remove clothing and launder. Wash your hands after handling contaminated clothing. Place all used disposable facemasks, gloves, and other waste in a lined container before disposing them with other household waste. Remove gloves and wash your hands immediately after handling these items.  Do not share dishes, glasses, or other household items with the patient Avoid sharing household items. You should not share dishes, drinking glasses, cups, eating utensils, towels, bedding, or other items with a patient who is confirmed to have, or being evaluated for, COVID-19 infection. After the person uses these items, you should wash them thoroughly with soap and water.  Wash laundry thoroughly Immediately remove and wash clothes or bedding that have blood, body fluids, and/or secretions or excretions, such as sweat, saliva, sputum, nasal mucus, vomit, urine, or feces, on them. Wear gloves when handling laundry from the patient. Read and follow directions on labels of laundry or clothing items and detergent. In general, wash and dry with the warmest temperatures recommended on the label.  Clean all areas the individual has used often Clean all touchable surfaces, such as counters, tabletops, doorknobs, bathroom fixtures, toilets, phones, keyboards, tablets, and bedside tables, every day. Also, clean any surfaces that may have blood, body fluids, and/or secretions or excretions on them. Wear gloves when cleaning surfaces the patient has come in contact with. Use a diluted bleach solution (e.g., dilute bleach with 1 part bleach and 10 parts water) or a household disinfectant with a label that says EPA-registered for coronaviruses. To make a bleach solution at home, add 1 tablespoon of bleach to 1 quart (4 cups) of water. For a larger supply, add  cup of bleach to 1 gallon (16 cups) of water. Read labels of cleaning products and follow recommendations provided on product  labels. Labels contain instructions for safe and effective use of the cleaning product including precautions you should take when applying the product, such as wearing gloves or eye protection and making sure you have good ventilation during use of the product. Remove gloves and wash hands immediately after cleaning.  Monitor yourself for signs and symptoms of illness Caregivers and household members are considered close contacts, should monitor their health, and will be asked to limit movement outside of the home to the extent possible. Follow the monitoring steps for close contacts listed on the symptom monitoring form.  If you have additional questions, contact your local health department or call the epidemiologist on call at [phone] (available 24/7). This guidance is subject to change. For the most up-to-date guidance from CDC, please refer to their website: YouBlogs.pl   For home use only DME oxygen   Complete by: As directed    Length of Need: 6 Months   Mode or (Route): Nasal cannula   Liters  per Minute: 2   Frequency: Continuous (stationary and portable oxygen unit needed)   Oxygen delivery system: Gas   Increase activity slowly   Complete by: As directed    MyChart COVID-19 home monitoring program   Complete by: Jun 21, 2019    Is the patient willing to use the [place] for home monitoring?: Yes     Allergies as of 06/21/2019   No Known Allergies     Medication List    STOP taking these medications   amoxicillin-clavulanate 500-125 MG tablet Commonly known as: Augmentin   HYDROcodone-acetaminophen 5-325 MG tablet Commonly known as: Norco     TAKE these medications   acetaminophen 500 MG tablet Commonly known as: TYLENOL Take 1,000 mg by mouth every 6 (six) hours as needed for mild pain or fever.   acyclovir 400 MG tablet Commonly known as: ZOVIRAX Take 400 mg by mouth 2 (two) times  daily.   amLODipine 10 MG tablet Commonly known as: NORVASC Take 10 mg by mouth daily.   blood glucose meter kit and supplies Dispense based on patient and insurance preference. Use up to four times daily as directed. (FOR ICD-10 E10.9, E11.9).   dexamethasone 6 MG tablet Commonly known as: Decadron Take 1 tablet (6 mg total) by mouth daily for 5 days.   guaiFENesin-dextromethorphan 100-10 MG/5ML syrup Commonly known as: ROBITUSSIN DM Take 5 mLs by mouth every 4 (four) hours as needed for cough.   ibuprofen 400 MG tablet Commonly known as: ADVIL Take 400 mg by mouth every 6 (six) hours as needed for fever or moderate pain.   metFORMIN 500 MG tablet Commonly known as: Glucophage Take 1 tablet (500 mg total) by mouth 2 (two) times daily with a meal.   rosuvastatin 20 MG tablet Commonly known as: CRESTOR Take 20 mg by mouth every evening.            Durable Medical Equipment  (From admission, onward)         Start     Ordered   06/21/19 0000  For home use only DME oxygen    Question Answer Comment  Length of Need 6 Months   Mode or (Route) Nasal cannula   Liters per Minute 2   Frequency Continuous (stationary and portable oxygen unit needed)   Oxygen delivery system Gas      06/21/19 0658          No Known Allergies  Consultations:  Diabetes coordinator  Procedures/Studies: DG Chest Port 1 View  Result Date: 06/17/2019 CLINICAL DATA:  Shortness of breath, COVID-19 positive. EXAM: PORTABLE CHEST 1 VIEW COMPARISON:  None. FINDINGS: The heart size and mediastinal contours are within normal limits. Multiple ill-defined airspace opacities are noted in both lungs, right greater than left. The visualized skeletal structures are unremarkable. IMPRESSION: Multiple lung opacities are noted bilaterally concerning for multifocal pneumonia, potentially of viral etiology. Electronically Signed   By: Marijo Conception M.D.   On: 06/17/2019 15:23    Subjective: Tim Hunt  #[id number], Spanish interpretor present throughout encounjter. Patient reports improvement overall, still some "agitation" with ambulation, but functionally able to get around fine with some shortnerss of breath. Amenable to short term use of oxygen and use of metformin until he follows up with his PCP 21 days after his first positive test. No chest pain, +nasal congestion.   Discharge Exam: Vitals:   06/20/19 1948 06/21/19 0504  BP: (!) 143/82 140/86  Pulse: 66 62  Resp: (!) 22  18  Temp: 98.8 F (37.1 C) 98.4 F (36.9 C)  SpO2: 96% 92%   General: Pt is alert, awake, not in acute distress Cardiovascular: RRR, S1/S2 +, no rubs, no gallops Respiratory: CTA bilaterally, no wheezing, no rhonchi Abdominal: Soft, NT, ND, bowel sounds + Extremities: No edema, no cyanosis  Labs: BNP (last 3 results) No results for input(s): BNP in the last 8760 hours. Basic Metabolic Panel: Recent Labs  Lab 06/17/19 1350 06/18/19 0500 06/19/19 0220 06/20/19 0045 06/21/19 0224  NA 138 137 140 140 141  K 4.2 4.1 4.7 4.3 4.7  CL 99 99 103 104 102  CO2 26 24 25 26 28   GLUCOSE 112* 172* 148* 175* 184*  BUN 14 16 27* 27* 24*  CREATININE 1.20 0.97 0.86 0.86 0.83  CALCIUM 8.7* 8.7* 8.9 8.8* 9.0  MG  --  2.3  --   --   --   PHOS  --  3.5  --   --   --    Liver Function Tests: Recent Labs  Lab 06/17/19 1350 06/18/19 0500 06/19/19 0220 06/20/19 0045 06/21/19 0224  AST 106* 132* 111* 109* 119*  ALT 168* 200* 191* 214* 269*  ALKPHOS 49 54 53 54 58  BILITOT 0.5 0.4 0.5 0.5 0.4  PROT 8.0 8.0 7.7 7.3 7.5  ALBUMIN 3.7 3.6 3.5 3.2* 3.3*   No results for input(s): LIPASE, AMYLASE in the last 168 hours. No results for input(s): AMMONIA in the last 168 hours. CBC: Recent Labs  Lab 06/17/19 1350 06/18/19 0500 06/19/19 0220 06/20/19 0045 06/21/19 0224  WBC 6.8 7.5 10.8* 12.8* 10.5  NEUTROABS 5.6 6.5 9.5* 10.8* 8.1*  HGB 13.1 12.6* 12.2* 11.7* 12.4*  HCT 43.0 40.8 40.0 38.4* 40.5  MCV 74.3* 73.6*  73.7* 73.6* 73.9*  PLT 197 216 252 319 372   Cardiac Enzymes: No results for input(s): CKTOTAL, CKMB, CKMBINDEX, TROPONINI in the last 168 hours. BNP: Invalid input(s): POCBNP CBG: Recent Labs  Lab 06/20/19 0811 06/20/19 1149 06/20/19 1807 06/20/19 2202 06/21/19 0729  GLUCAP 170* 207* 143* 149* 141*   D-Dimer Recent Labs    06/20/19 0045 06/21/19 0224  DDIMER 0.95* 1.14*   Hgb A1c Recent Labs    06/20/19 0045  HGBA1C 7.2*   Lipid Profile No results for input(s): CHOL, HDL, LDLCALC, TRIG, CHOLHDL, LDLDIRECT in the last 72 hours. Thyroid function studies No results for input(s): TSH, T4TOTAL, T3FREE, THYROIDAB in the last 72 hours.  Invalid input(s): FREET3 Anemia work up Recent Labs    06/20/19 0045 06/21/19 0224  FERRITIN 900* 588*   Urinalysis No results found for: COLORURINE, APPEARANCEUR, LABSPEC, [place], GLUCOSEU, HGBUR, BILIRUBINUR, KETONESUR, PROTEINUR, UROBILINOGEN, NITRITE, [place]  Microbiology Recent Results (from the past 240 hour(s))  Blood Culture (routine x 2)     Status: None (Preliminary result)   Collection Time: 06/17/19  1:07 PM   Specimen: BLOOD  Result Value Ref Range Status   Specimen Description   Final    BLOOD RIGHT ANTECUBITAL Performed at [facility] [address] [place], [place] [address]    Special Requests   Final    BOTTLES DRAWN AEROBIC AND ANAEROBIC Blood Culture adequate volume Performed at [facility] [address], [place],  [address]    Culture   Final    NO GROWTH 4 DAYS Performed at [facility], [place] [address], [place],  [address]    Report Status PENDING  Incomplete  Blood Culture (routine  x 2)     Status: None (Preliminary result)   Collection Time: 06/17/19  1:50 PM   Specimen: BLOOD  Result Value Ref Range Status   Specimen Description   Final    BLOOD LEFT ANTECUBITAL Performed at [facility] [address], [place], [place] [address]    Special Requests   Final    BOTTLES DRAWN AEROBIC AND ANAEROBIC Blood Culture adequate volume Performed at [facility] [address] [place], [place] [address]    Culture   Final    NO GROWTH 4 DAYS Performed at [facility], [place] [address], [place], [place] [address]    Report Status PENDING  Incomplete    Time coordinating discharge: Approximately 40 minutes  Patrecia Pour, MD  [facility] 06/21/2019, 9:16 AM

## 2019-06-21 NOTE — Progress Notes (Signed)
SATURATION QUALIFICATIONS: (This note is used to comply with regulatory documentation for home oxygen)  Patient Saturations on Room Air at Rest = 98%  Patient Saturations on Room Air while Ambulating = 94%  Patient 02 stable on RA.

## 2019-06-22 LAB — CULTURE, BLOOD (ROUTINE X 2)
Culture: NO GROWTH
Culture: NO GROWTH
Special Requests: ADEQUATE
Special Requests: ADEQUATE

## 2019-07-30 ENCOUNTER — Other Ambulatory Visit: Payer: Self-pay

## 2019-07-30 ENCOUNTER — Ambulatory Visit: Payer: BC Managed Care – PPO | Admitting: Podiatry

## 2019-07-30 VITALS — BP 140/81 | HR 71 | Temp 97.4°F

## 2019-07-30 DIAGNOSIS — B351 Tinea unguium: Secondary | ICD-10-CM

## 2019-08-01 NOTE — Progress Notes (Signed)
   Subjective: 54 y.o. male presenting today as a new patient with a chief complaint of possible nail fungus of the bilateral great toes that has been ongoing for the past year. He reports associated discoloration and thickening of the nails. He has tried several different OTC treatments with no significant relief. He denies modifying factors. Patient is here for further evaluation and treatment.   Past Medical History:  Diagnosis Date  . Blood dyscrasia    "red and white blood cells run low"  . Hypercholesterolemia   . Hypertension   . Sinusitis, chronic   . Sleep apnea    does not use CPAP anymore    Objective: Physical Exam General: The patient is alert and oriented x3 in no acute distress.  Dermatology: Hyperkeratotic, discolored, thickened, onychodystrophy noted to the bilateral great toenails. Skin is warm, dry and supple bilateral lower extremities. Negative for open lesions or macerations.  Vascular: Palpable pedal pulses bilaterally. No edema or erythema noted. Capillary refill within normal limits.  Neurological: Epicritic and protective threshold grossly intact bilaterally.   Musculoskeletal Exam: Range of motion within normal limits to all pedal and ankle joints bilateral. Muscle strength 5/5 in all groups bilateral.   Assessment: #1 Onychomycosis bilateral great toenails  #2 Hyperkeratotic nails bilateral great toenails   Plan of Care:  #1 Patient was evaluated. #2 Laser treatment with RN for 6 months.  #3 OTC Tolcylen topical solution provided to patient.  #4 Patient's liver enzymes are elevated. No oral antifungal medication at this time.  #5 Return to clinic in 6 months. If not better, we will consider nail avulsions.   Patient from [place]. Fiance, Misty, is an Char[employer] fundraiser. I talked to her on the phone during the visit.    Felecia Shelling, DPM [facility]  Dr. Felecia Shelling, DPM    [address]                                          [place], [place] [address]                Office [phone]  Fax [phone]

## 2019-08-04 ENCOUNTER — Other Ambulatory Visit: Payer: BC Managed Care – PPO

## 2019-08-06 ENCOUNTER — Other Ambulatory Visit: Payer: Self-pay

## 2020-01-28 ENCOUNTER — Ambulatory Visit: Payer: BC Managed Care – PPO | Admitting: Podiatry

## 2020-11-23 IMAGING — DX DG CHEST 1V PORT
1 series · 1 of 1 positions shown · non-contrast
Comparison: None.

CLINICAL DATA: Shortness of breath, A25V9-8V positive.

EXAM:
PORTABLE CHEST 1 VIEW

[chest ap]
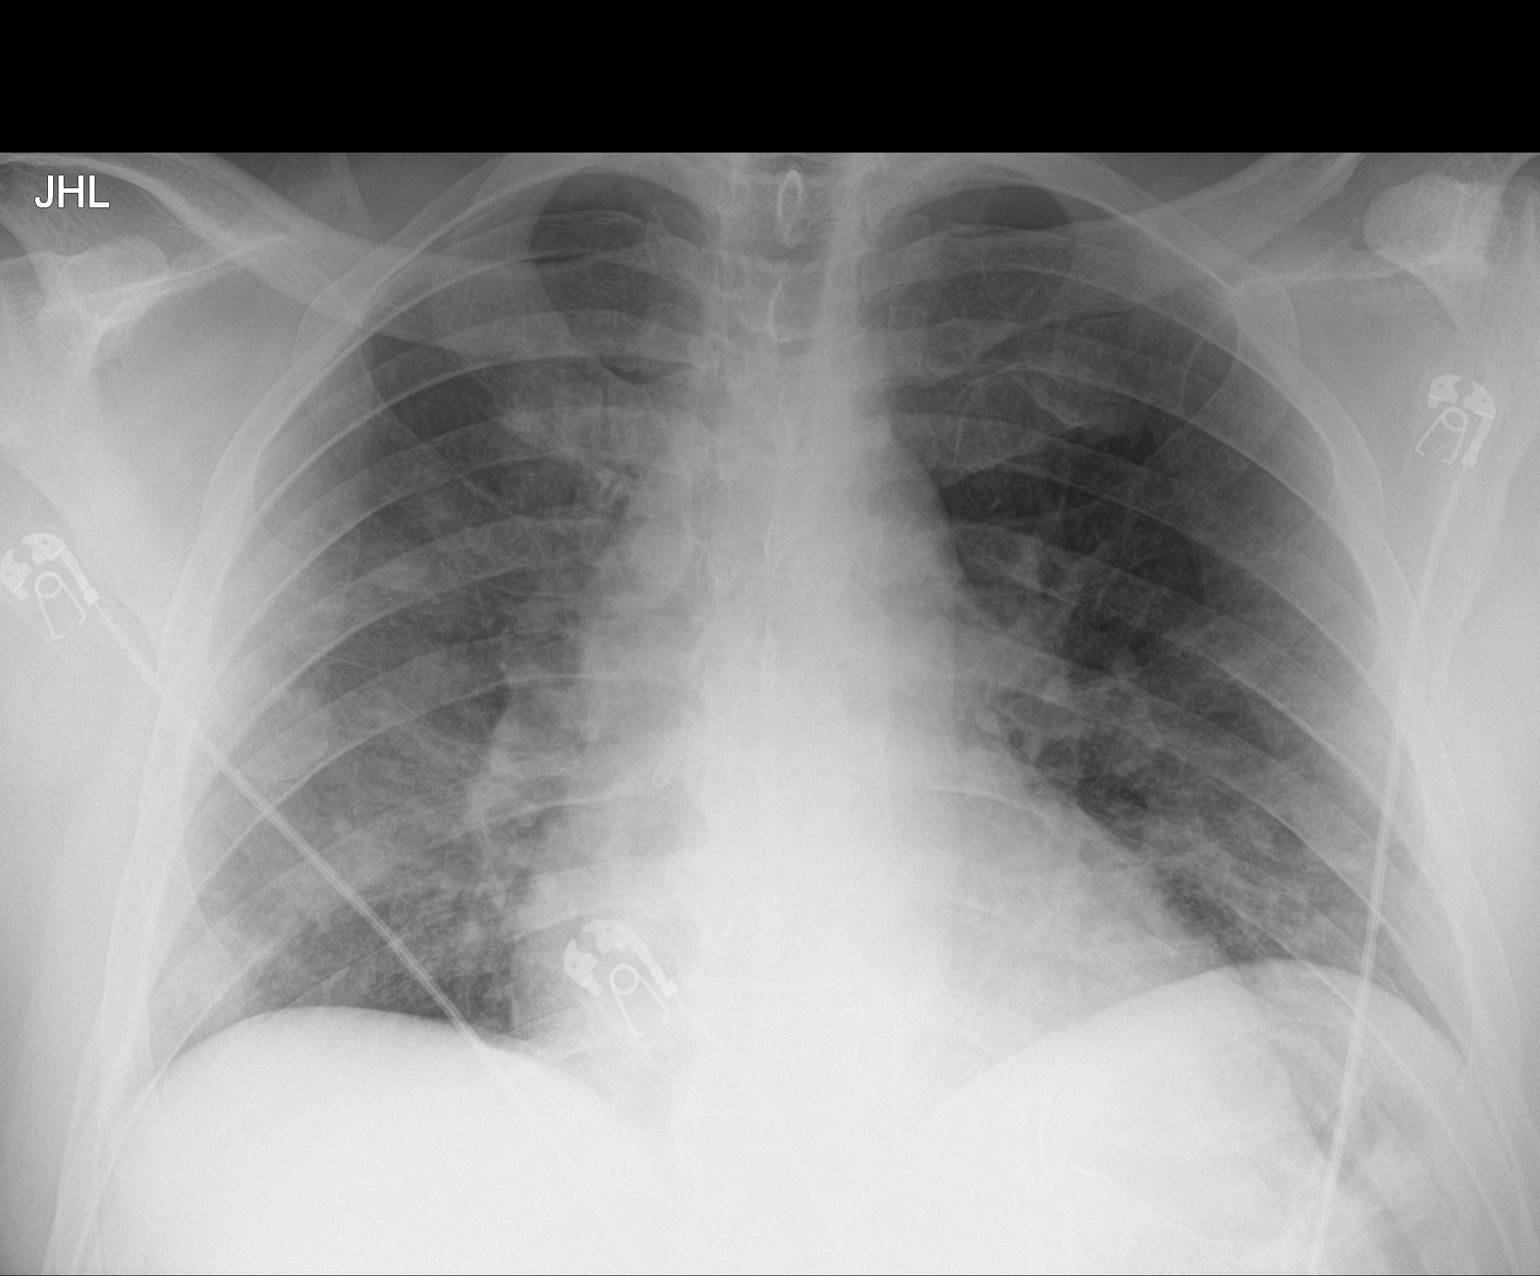

[1 of 1 positions shown; findings below may reference images not displayed]

FINDINGS: The heart size and mediastinal contours are within normal limits.
Multiple ill-defined airspace opacities are noted in both lungs,
right greater than left. The visualized skeletal structures are
unremarkable.
IMPRESSION: Multiple lung opacities are noted bilaterally concerning for
multifocal pneumonia, potentially of viral etiology.
